# Patient Record
Sex: Male | Born: 1945 | Race: White | Hispanic: No | Marital: Married | Smoking: Former smoker
Health system: Southern US, Community
[De-identification: ages and names within clinical notes are randomized; demographics above are authoritative.]

## PROBLEM LIST (undated history)

## (undated) DIAGNOSIS — K219 Gastro-esophageal reflux disease without esophagitis: Secondary | ICD-10-CM

## (undated) DIAGNOSIS — I2510 Atherosclerotic heart disease of native coronary artery without angina pectoris: Secondary | ICD-10-CM

## (undated) DIAGNOSIS — G8929 Other chronic pain: Secondary | ICD-10-CM

## (undated) DIAGNOSIS — I214 Non-ST elevation (NSTEMI) myocardial infarction: Secondary | ICD-10-CM

## (undated) DIAGNOSIS — I8390 Asymptomatic varicose veins of unspecified lower extremity: Secondary | ICD-10-CM

## (undated) DIAGNOSIS — N400 Enlarged prostate without lower urinary tract symptoms: Secondary | ICD-10-CM

## (undated) DIAGNOSIS — M549 Dorsalgia, unspecified: Secondary | ICD-10-CM

## (undated) DIAGNOSIS — I10 Essential (primary) hypertension: Secondary | ICD-10-CM

## (undated) HISTORY — DX: Asymptomatic varicose veins of unspecified lower extremity: I83.90

## (undated) HISTORY — DX: Dorsalgia, unspecified: M54.9

## (undated) HISTORY — DX: Non-ST elevation (NSTEMI) myocardial infarction: I21.4

## (undated) HISTORY — DX: Essential (primary) hypertension: I10

## (undated) HISTORY — PX: CHOLECYSTECTOMY: SHX55

## (undated) HISTORY — DX: Atherosclerotic heart disease of native coronary artery without angina pectoris: I25.10

## (undated) HISTORY — PX: SPINAL FUSION: SHX223

## (undated) HISTORY — DX: Gastro-esophageal reflux disease without esophagitis: K21.9

## (undated) HISTORY — DX: Benign prostatic hyperplasia without lower urinary tract symptoms: N40.0

## (undated) HISTORY — DX: Other chronic pain: G89.29

---

## 2015-10-24 DIAGNOSIS — N390 Urinary tract infection, site not specified: Secondary | ICD-10-CM | POA: Diagnosis not present

## 2015-10-24 DIAGNOSIS — E669 Obesity, unspecified: Secondary | ICD-10-CM | POA: Diagnosis not present

## 2015-10-24 DIAGNOSIS — Z6841 Body Mass Index (BMI) 40.0 and over, adult: Secondary | ICD-10-CM | POA: Diagnosis not present

## 2015-10-24 DIAGNOSIS — N400 Enlarged prostate without lower urinary tract symptoms: Secondary | ICD-10-CM | POA: Diagnosis not present

## 2015-10-24 DIAGNOSIS — M545 Low back pain: Secondary | ICD-10-CM | POA: Diagnosis not present

## 2015-10-24 DIAGNOSIS — Z87891 Personal history of nicotine dependence: Secondary | ICD-10-CM | POA: Diagnosis not present

## 2015-11-16 DIAGNOSIS — N419 Inflammatory disease of prostate, unspecified: Secondary | ICD-10-CM | POA: Diagnosis not present

## 2015-11-16 DIAGNOSIS — R3915 Urgency of urination: Secondary | ICD-10-CM | POA: Diagnosis not present

## 2015-11-16 DIAGNOSIS — Z87891 Personal history of nicotine dependence: Secondary | ICD-10-CM | POA: Diagnosis not present

## 2015-11-23 DIAGNOSIS — M1288 Other specific arthropathies, not elsewhere classified, other specified site: Secondary | ICD-10-CM | POA: Diagnosis not present

## 2015-11-23 DIAGNOSIS — M47816 Spondylosis without myelopathy or radiculopathy, lumbar region: Secondary | ICD-10-CM | POA: Diagnosis not present

## 2015-11-23 DIAGNOSIS — M4316 Spondylolisthesis, lumbar region: Secondary | ICD-10-CM | POA: Diagnosis not present

## 2015-11-30 DIAGNOSIS — Z6841 Body Mass Index (BMI) 40.0 and over, adult: Secondary | ICD-10-CM | POA: Diagnosis not present

## 2015-11-30 DIAGNOSIS — Z1389 Encounter for screening for other disorder: Secondary | ICD-10-CM | POA: Diagnosis not present

## 2015-11-30 DIAGNOSIS — E7800 Pure hypercholesterolemia, unspecified: Secondary | ICD-10-CM | POA: Diagnosis not present

## 2015-11-30 DIAGNOSIS — Z125 Encounter for screening for malignant neoplasm of prostate: Secondary | ICD-10-CM | POA: Diagnosis not present

## 2015-11-30 DIAGNOSIS — Z0000 Encounter for general adult medical examination without abnormal findings: Secondary | ICD-10-CM | POA: Diagnosis not present

## 2015-11-30 DIAGNOSIS — Z7189 Other specified counseling: Secondary | ICD-10-CM | POA: Diagnosis not present

## 2015-11-30 DIAGNOSIS — Z299 Encounter for prophylactic measures, unspecified: Secondary | ICD-10-CM | POA: Diagnosis not present

## 2015-11-30 DIAGNOSIS — Z1211 Encounter for screening for malignant neoplasm of colon: Secondary | ICD-10-CM | POA: Diagnosis not present

## 2015-11-30 DIAGNOSIS — R5383 Other fatigue: Secondary | ICD-10-CM | POA: Diagnosis not present

## 2016-01-18 DIAGNOSIS — M5386 Other specified dorsopathies, lumbar region: Secondary | ICD-10-CM | POA: Diagnosis not present

## 2016-01-18 DIAGNOSIS — J9811 Atelectasis: Secondary | ICD-10-CM | POA: Diagnosis not present

## 2016-01-18 DIAGNOSIS — E669 Obesity, unspecified: Secondary | ICD-10-CM | POA: Diagnosis not present

## 2016-01-18 DIAGNOSIS — Z981 Arthrodesis status: Secondary | ICD-10-CM | POA: Diagnosis not present

## 2016-01-18 DIAGNOSIS — Z6841 Body Mass Index (BMI) 40.0 and over, adult: Secondary | ICD-10-CM | POA: Diagnosis not present

## 2016-01-18 DIAGNOSIS — I10 Essential (primary) hypertension: Secondary | ICD-10-CM | POA: Diagnosis not present

## 2016-01-18 DIAGNOSIS — Z79899 Other long term (current) drug therapy: Secondary | ICD-10-CM | POA: Diagnosis not present

## 2016-01-18 DIAGNOSIS — M47896 Other spondylosis, lumbar region: Secondary | ICD-10-CM | POA: Diagnosis not present

## 2016-01-18 DIAGNOSIS — E871 Hypo-osmolality and hyponatremia: Secondary | ICD-10-CM | POA: Diagnosis not present

## 2016-01-18 DIAGNOSIS — M4316 Spondylolisthesis, lumbar region: Secondary | ICD-10-CM | POA: Diagnosis not present

## 2016-01-18 DIAGNOSIS — K567 Ileus, unspecified: Secondary | ICD-10-CM | POA: Diagnosis not present

## 2016-01-18 DIAGNOSIS — N400 Enlarged prostate without lower urinary tract symptoms: Secondary | ICD-10-CM | POA: Diagnosis not present

## 2016-01-18 DIAGNOSIS — K219 Gastro-esophageal reflux disease without esophagitis: Secondary | ICD-10-CM | POA: Diagnosis not present

## 2016-01-18 DIAGNOSIS — K5660 Unspecified intestinal obstruction: Secondary | ICD-10-CM | POA: Diagnosis not present

## 2016-01-18 DIAGNOSIS — Z888 Allergy status to other drugs, medicaments and biological substances status: Secondary | ICD-10-CM | POA: Diagnosis not present

## 2016-01-18 DIAGNOSIS — Z4682 Encounter for fitting and adjustment of non-vascular catheter: Secondary | ICD-10-CM | POA: Diagnosis not present

## 2016-01-18 DIAGNOSIS — M1288 Other specific arthropathies, not elsewhere classified, other specified site: Secondary | ICD-10-CM | POA: Diagnosis not present

## 2016-01-18 DIAGNOSIS — M069 Rheumatoid arthritis, unspecified: Secondary | ICD-10-CM | POA: Diagnosis not present

## 2016-01-18 DIAGNOSIS — M47816 Spondylosis without myelopathy or radiculopathy, lumbar region: Secondary | ICD-10-CM | POA: Diagnosis not present

## 2016-01-25 DIAGNOSIS — M1288 Other specific arthropathies, not elsewhere classified, other specified site: Secondary | ICD-10-CM | POA: Diagnosis not present

## 2016-01-25 DIAGNOSIS — Z9889 Other specified postprocedural states: Secondary | ICD-10-CM | POA: Diagnosis not present

## 2016-01-25 DIAGNOSIS — M4186 Other forms of scoliosis, lumbar region: Secondary | ICD-10-CM | POA: Diagnosis not present

## 2016-01-30 DIAGNOSIS — M549 Dorsalgia, unspecified: Secondary | ICD-10-CM | POA: Diagnosis not present

## 2016-01-30 DIAGNOSIS — Z299 Encounter for prophylactic measures, unspecified: Secondary | ICD-10-CM | POA: Diagnosis not present

## 2016-01-30 DIAGNOSIS — E7800 Pure hypercholesterolemia, unspecified: Secondary | ICD-10-CM | POA: Diagnosis not present

## 2016-01-30 DIAGNOSIS — I10 Essential (primary) hypertension: Secondary | ICD-10-CM | POA: Diagnosis not present

## 2016-02-15 DIAGNOSIS — R079 Chest pain, unspecified: Secondary | ICD-10-CM | POA: Diagnosis not present

## 2016-02-15 DIAGNOSIS — R0989 Other specified symptoms and signs involving the circulatory and respiratory systems: Secondary | ICD-10-CM | POA: Diagnosis not present

## 2016-02-15 DIAGNOSIS — J189 Pneumonia, unspecified organism: Secondary | ICD-10-CM | POA: Diagnosis not present

## 2016-02-15 DIAGNOSIS — R0602 Shortness of breath: Secondary | ICD-10-CM | POA: Diagnosis not present

## 2016-02-15 DIAGNOSIS — Z87891 Personal history of nicotine dependence: Secondary | ICD-10-CM | POA: Diagnosis not present

## 2016-02-15 DIAGNOSIS — R05 Cough: Secondary | ICD-10-CM | POA: Diagnosis not present

## 2016-02-15 DIAGNOSIS — R918 Other nonspecific abnormal finding of lung field: Secondary | ICD-10-CM | POA: Diagnosis not present

## 2016-02-21 DIAGNOSIS — M170 Bilateral primary osteoarthritis of knee: Secondary | ICD-10-CM | POA: Diagnosis present

## 2016-02-21 DIAGNOSIS — J189 Pneumonia, unspecified organism: Secondary | ICD-10-CM | POA: Diagnosis not present

## 2016-02-21 DIAGNOSIS — J440 Chronic obstructive pulmonary disease with acute lower respiratory infection: Secondary | ICD-10-CM | POA: Diagnosis not present

## 2016-02-21 DIAGNOSIS — I10 Essential (primary) hypertension: Secondary | ICD-10-CM | POA: Diagnosis present

## 2016-02-21 DIAGNOSIS — Z87891 Personal history of nicotine dependence: Secondary | ICD-10-CM | POA: Diagnosis not present

## 2016-02-21 DIAGNOSIS — Z79899 Other long term (current) drug therapy: Secondary | ICD-10-CM | POA: Diagnosis not present

## 2016-02-21 DIAGNOSIS — E669 Obesity, unspecified: Secondary | ICD-10-CM | POA: Diagnosis present

## 2016-02-21 DIAGNOSIS — Z6841 Body Mass Index (BMI) 40.0 and over, adult: Secondary | ICD-10-CM | POA: Diagnosis not present

## 2016-02-21 DIAGNOSIS — Z981 Arthrodesis status: Secondary | ICD-10-CM | POA: Diagnosis not present

## 2016-02-21 DIAGNOSIS — E119 Type 2 diabetes mellitus without complications: Secondary | ICD-10-CM | POA: Diagnosis not present

## 2016-03-08 DIAGNOSIS — Z299 Encounter for prophylactic measures, unspecified: Secondary | ICD-10-CM | POA: Diagnosis not present

## 2016-03-08 DIAGNOSIS — M549 Dorsalgia, unspecified: Secondary | ICD-10-CM | POA: Diagnosis not present

## 2016-03-08 DIAGNOSIS — I10 Essential (primary) hypertension: Secondary | ICD-10-CM | POA: Diagnosis not present

## 2016-03-08 DIAGNOSIS — J189 Pneumonia, unspecified organism: Secondary | ICD-10-CM | POA: Diagnosis not present

## 2016-04-04 DIAGNOSIS — Z23 Encounter for immunization: Secondary | ICD-10-CM | POA: Diagnosis not present

## 2016-06-07 DIAGNOSIS — E7800 Pure hypercholesterolemia, unspecified: Secondary | ICD-10-CM | POA: Diagnosis not present

## 2016-06-07 DIAGNOSIS — Z713 Dietary counseling and surveillance: Secondary | ICD-10-CM | POA: Diagnosis not present

## 2016-06-07 DIAGNOSIS — I10 Essential (primary) hypertension: Secondary | ICD-10-CM | POA: Diagnosis not present

## 2016-06-07 DIAGNOSIS — Z6839 Body mass index (BMI) 39.0-39.9, adult: Secondary | ICD-10-CM | POA: Diagnosis not present

## 2016-06-07 DIAGNOSIS — Z299 Encounter for prophylactic measures, unspecified: Secondary | ICD-10-CM | POA: Diagnosis not present

## 2016-06-14 DIAGNOSIS — Z23 Encounter for immunization: Secondary | ICD-10-CM | POA: Diagnosis not present

## 2016-07-24 DIAGNOSIS — M47816 Spondylosis without myelopathy or radiculopathy, lumbar region: Secondary | ICD-10-CM | POA: Diagnosis not present

## 2016-09-23 DIAGNOSIS — M47816 Spondylosis without myelopathy or radiculopathy, lumbar region: Secondary | ICD-10-CM | POA: Diagnosis not present

## 2016-10-03 DIAGNOSIS — M47816 Spondylosis without myelopathy or radiculopathy, lumbar region: Secondary | ICD-10-CM | POA: Diagnosis not present

## 2016-10-03 DIAGNOSIS — M9983 Other biomechanical lesions of lumbar region: Secondary | ICD-10-CM | POA: Diagnosis not present

## 2016-10-03 DIAGNOSIS — M79604 Pain in right leg: Secondary | ICD-10-CM | POA: Diagnosis not present

## 2016-10-03 DIAGNOSIS — M48061 Spinal stenosis, lumbar region without neurogenic claudication: Secondary | ICD-10-CM | POA: Diagnosis not present

## 2016-10-03 DIAGNOSIS — Z981 Arthrodesis status: Secondary | ICD-10-CM | POA: Diagnosis not present

## 2016-10-03 DIAGNOSIS — M5126 Other intervertebral disc displacement, lumbar region: Secondary | ICD-10-CM | POA: Diagnosis not present

## 2016-10-09 DIAGNOSIS — M4316 Spondylolisthesis, lumbar region: Secondary | ICD-10-CM | POA: Diagnosis not present

## 2016-10-09 DIAGNOSIS — M1288 Other specific arthropathies, not elsewhere classified, other specified site: Secondary | ICD-10-CM | POA: Diagnosis not present

## 2016-10-09 DIAGNOSIS — M47816 Spondylosis without myelopathy or radiculopathy, lumbar region: Secondary | ICD-10-CM | POA: Diagnosis not present

## 2016-10-23 DIAGNOSIS — M4316 Spondylolisthesis, lumbar region: Secondary | ICD-10-CM | POA: Diagnosis not present

## 2016-10-23 DIAGNOSIS — M47816 Spondylosis without myelopathy or radiculopathy, lumbar region: Secondary | ICD-10-CM | POA: Diagnosis not present

## 2016-12-16 DIAGNOSIS — M47816 Spondylosis without myelopathy or radiculopathy, lumbar region: Secondary | ICD-10-CM | POA: Diagnosis not present

## 2016-12-16 DIAGNOSIS — M4316 Spondylolisthesis, lumbar region: Secondary | ICD-10-CM | POA: Diagnosis not present

## 2017-01-13 DIAGNOSIS — E669 Obesity, unspecified: Secondary | ICD-10-CM | POA: Diagnosis not present

## 2017-01-13 DIAGNOSIS — Z6841 Body Mass Index (BMI) 40.0 and over, adult: Secondary | ICD-10-CM | POA: Diagnosis not present

## 2017-01-13 DIAGNOSIS — K210 Gastro-esophageal reflux disease with esophagitis: Secondary | ICD-10-CM | POA: Diagnosis not present

## 2017-01-13 DIAGNOSIS — K219 Gastro-esophageal reflux disease without esophagitis: Secondary | ICD-10-CM | POA: Diagnosis not present

## 2017-01-13 DIAGNOSIS — I10 Essential (primary) hypertension: Secondary | ICD-10-CM | POA: Diagnosis not present

## 2017-01-13 DIAGNOSIS — Z713 Dietary counseling and surveillance: Secondary | ICD-10-CM | POA: Diagnosis not present

## 2017-01-13 DIAGNOSIS — Z299 Encounter for prophylactic measures, unspecified: Secondary | ICD-10-CM | POA: Diagnosis not present

## 2017-01-13 DIAGNOSIS — E7800 Pure hypercholesterolemia, unspecified: Secondary | ICD-10-CM | POA: Diagnosis not present

## 2017-03-05 DIAGNOSIS — C44319 Basal cell carcinoma of skin of other parts of face: Secondary | ICD-10-CM | POA: Diagnosis not present

## 2017-03-05 DIAGNOSIS — D485 Neoplasm of uncertain behavior of skin: Secondary | ICD-10-CM | POA: Diagnosis not present

## 2017-03-05 DIAGNOSIS — Z85820 Personal history of malignant melanoma of skin: Secondary | ICD-10-CM | POA: Diagnosis not present

## 2017-03-05 DIAGNOSIS — L570 Actinic keratosis: Secondary | ICD-10-CM | POA: Diagnosis not present

## 2017-03-05 DIAGNOSIS — L820 Inflamed seborrheic keratosis: Secondary | ICD-10-CM | POA: Diagnosis not present

## 2017-03-05 DIAGNOSIS — C4441 Basal cell carcinoma of skin of scalp and neck: Secondary | ICD-10-CM | POA: Diagnosis not present

## 2017-03-17 DIAGNOSIS — Z0000 Encounter for general adult medical examination without abnormal findings: Secondary | ICD-10-CM | POA: Diagnosis not present

## 2017-03-17 DIAGNOSIS — R5383 Other fatigue: Secondary | ICD-10-CM | POA: Diagnosis not present

## 2017-03-17 DIAGNOSIS — Z6841 Body Mass Index (BMI) 40.0 and over, adult: Secondary | ICD-10-CM | POA: Diagnosis not present

## 2017-03-17 DIAGNOSIS — Z1389 Encounter for screening for other disorder: Secondary | ICD-10-CM | POA: Diagnosis not present

## 2017-03-17 DIAGNOSIS — N400 Enlarged prostate without lower urinary tract symptoms: Secondary | ICD-10-CM | POA: Diagnosis not present

## 2017-03-17 DIAGNOSIS — I8390 Asymptomatic varicose veins of unspecified lower extremity: Secondary | ICD-10-CM | POA: Diagnosis not present

## 2017-03-17 DIAGNOSIS — Z1211 Encounter for screening for malignant neoplasm of colon: Secondary | ICD-10-CM | POA: Diagnosis not present

## 2017-03-17 DIAGNOSIS — I10 Essential (primary) hypertension: Secondary | ICD-10-CM | POA: Diagnosis not present

## 2017-03-17 DIAGNOSIS — Z299 Encounter for prophylactic measures, unspecified: Secondary | ICD-10-CM | POA: Diagnosis not present

## 2017-03-17 DIAGNOSIS — Z79899 Other long term (current) drug therapy: Secondary | ICD-10-CM | POA: Diagnosis not present

## 2017-03-17 DIAGNOSIS — Z7189 Other specified counseling: Secondary | ICD-10-CM | POA: Diagnosis not present

## 2017-03-17 DIAGNOSIS — E7800 Pure hypercholesterolemia, unspecified: Secondary | ICD-10-CM | POA: Diagnosis not present

## 2017-03-18 DIAGNOSIS — R5383 Other fatigue: Secondary | ICD-10-CM | POA: Diagnosis not present

## 2017-03-18 DIAGNOSIS — E7800 Pure hypercholesterolemia, unspecified: Secondary | ICD-10-CM | POA: Diagnosis not present

## 2017-03-18 DIAGNOSIS — Z125 Encounter for screening for malignant neoplasm of prostate: Secondary | ICD-10-CM | POA: Diagnosis not present

## 2017-03-18 DIAGNOSIS — Z79899 Other long term (current) drug therapy: Secondary | ICD-10-CM | POA: Diagnosis not present

## 2017-03-18 DIAGNOSIS — M47816 Spondylosis without myelopathy or radiculopathy, lumbar region: Secondary | ICD-10-CM | POA: Diagnosis not present

## 2017-03-25 DIAGNOSIS — Z23 Encounter for immunization: Secondary | ICD-10-CM | POA: Diagnosis not present

## 2017-04-10 DIAGNOSIS — C4441 Basal cell carcinoma of skin of scalp and neck: Secondary | ICD-10-CM | POA: Diagnosis not present

## 2017-04-10 DIAGNOSIS — C44319 Basal cell carcinoma of skin of other parts of face: Secondary | ICD-10-CM | POA: Diagnosis not present

## 2017-05-23 DIAGNOSIS — N400 Enlarged prostate without lower urinary tract symptoms: Secondary | ICD-10-CM | POA: Diagnosis present

## 2017-05-23 DIAGNOSIS — I200 Unstable angina: Secondary | ICD-10-CM | POA: Diagnosis not present

## 2017-05-23 DIAGNOSIS — Z888 Allergy status to other drugs, medicaments and biological substances status: Secondary | ICD-10-CM | POA: Diagnosis not present

## 2017-05-23 DIAGNOSIS — R600 Localized edema: Secondary | ICD-10-CM | POA: Diagnosis not present

## 2017-05-23 DIAGNOSIS — Z79899 Other long term (current) drug therapy: Secondary | ICD-10-CM | POA: Diagnosis not present

## 2017-05-23 DIAGNOSIS — M79602 Pain in left arm: Secondary | ICD-10-CM | POA: Diagnosis not present

## 2017-05-23 DIAGNOSIS — I21A1 Myocardial infarction type 2: Secondary | ICD-10-CM | POA: Diagnosis not present

## 2017-05-23 DIAGNOSIS — F419 Anxiety disorder, unspecified: Secondary | ICD-10-CM | POA: Diagnosis not present

## 2017-05-23 DIAGNOSIS — G8929 Other chronic pain: Secondary | ICD-10-CM | POA: Diagnosis not present

## 2017-05-23 DIAGNOSIS — I214 Non-ST elevation (NSTEMI) myocardial infarction: Secondary | ICD-10-CM | POA: Diagnosis not present

## 2017-05-23 DIAGNOSIS — K219 Gastro-esophageal reflux disease without esophagitis: Secondary | ICD-10-CM | POA: Diagnosis present

## 2017-05-23 DIAGNOSIS — I169 Hypertensive crisis, unspecified: Secondary | ICD-10-CM | POA: Diagnosis not present

## 2017-05-23 DIAGNOSIS — I10 Essential (primary) hypertension: Secondary | ICD-10-CM | POA: Diagnosis not present

## 2017-05-23 DIAGNOSIS — R079 Chest pain, unspecified: Secondary | ICD-10-CM | POA: Diagnosis not present

## 2017-05-23 DIAGNOSIS — I161 Hypertensive emergency: Secondary | ICD-10-CM | POA: Diagnosis not present

## 2017-05-23 DIAGNOSIS — M1990 Unspecified osteoarthritis, unspecified site: Secondary | ICD-10-CM | POA: Diagnosis not present

## 2017-05-23 DIAGNOSIS — I2510 Atherosclerotic heart disease of native coronary artery without angina pectoris: Secondary | ICD-10-CM | POA: Diagnosis not present

## 2017-05-23 DIAGNOSIS — I25110 Atherosclerotic heart disease of native coronary artery with unstable angina pectoris: Secondary | ICD-10-CM | POA: Diagnosis present

## 2017-05-24 DIAGNOSIS — I214 Non-ST elevation (NSTEMI) myocardial infarction: Secondary | ICD-10-CM | POA: Insufficient documentation

## 2017-05-30 DIAGNOSIS — E7800 Pure hypercholesterolemia, unspecified: Secondary | ICD-10-CM | POA: Diagnosis not present

## 2017-05-30 DIAGNOSIS — K219 Gastro-esophageal reflux disease without esophagitis: Secondary | ICD-10-CM | POA: Diagnosis not present

## 2017-05-30 DIAGNOSIS — Z6841 Body Mass Index (BMI) 40.0 and over, adult: Secondary | ICD-10-CM | POA: Diagnosis not present

## 2017-05-30 DIAGNOSIS — I2510 Atherosclerotic heart disease of native coronary artery without angina pectoris: Secondary | ICD-10-CM | POA: Diagnosis not present

## 2017-05-30 DIAGNOSIS — Z789 Other specified health status: Secondary | ICD-10-CM | POA: Diagnosis not present

## 2017-05-30 DIAGNOSIS — I10 Essential (primary) hypertension: Secondary | ICD-10-CM | POA: Diagnosis not present

## 2017-06-16 DIAGNOSIS — R0602 Shortness of breath: Secondary | ICD-10-CM | POA: Diagnosis not present

## 2017-06-16 DIAGNOSIS — I2510 Atherosclerotic heart disease of native coronary artery without angina pectoris: Secondary | ICD-10-CM | POA: Diagnosis not present

## 2017-06-16 DIAGNOSIS — E7800 Pure hypercholesterolemia, unspecified: Secondary | ICD-10-CM | POA: Diagnosis not present

## 2017-06-16 DIAGNOSIS — Z6841 Body Mass Index (BMI) 40.0 and over, adult: Secondary | ICD-10-CM | POA: Diagnosis not present

## 2017-06-16 DIAGNOSIS — I8390 Asymptomatic varicose veins of unspecified lower extremity: Secondary | ICD-10-CM | POA: Diagnosis not present

## 2017-06-16 DIAGNOSIS — I10 Essential (primary) hypertension: Secondary | ICD-10-CM | POA: Diagnosis not present

## 2017-06-16 DIAGNOSIS — Z299 Encounter for prophylactic measures, unspecified: Secondary | ICD-10-CM | POA: Diagnosis not present

## 2017-06-16 DIAGNOSIS — I219 Acute myocardial infarction, unspecified: Secondary | ICD-10-CM | POA: Diagnosis not present

## 2017-08-25 DIAGNOSIS — M79672 Pain in left foot: Secondary | ICD-10-CM | POA: Diagnosis not present

## 2017-08-25 DIAGNOSIS — M25579 Pain in unspecified ankle and joints of unspecified foot: Secondary | ICD-10-CM | POA: Diagnosis not present

## 2017-08-25 DIAGNOSIS — M722 Plantar fascial fibromatosis: Secondary | ICD-10-CM | POA: Diagnosis not present

## 2017-09-01 DIAGNOSIS — M549 Dorsalgia, unspecified: Secondary | ICD-10-CM | POA: Diagnosis not present

## 2017-09-01 DIAGNOSIS — I10 Essential (primary) hypertension: Secondary | ICD-10-CM | POA: Diagnosis not present

## 2017-09-01 DIAGNOSIS — E7800 Pure hypercholesterolemia, unspecified: Secondary | ICD-10-CM | POA: Diagnosis not present

## 2017-09-01 DIAGNOSIS — M791 Myalgia, unspecified site: Secondary | ICD-10-CM | POA: Diagnosis not present

## 2017-09-01 DIAGNOSIS — I8390 Asymptomatic varicose veins of unspecified lower extremity: Secondary | ICD-10-CM | POA: Diagnosis not present

## 2017-09-01 DIAGNOSIS — Z6838 Body mass index (BMI) 38.0-38.9, adult: Secondary | ICD-10-CM | POA: Diagnosis not present

## 2017-09-01 DIAGNOSIS — Z299 Encounter for prophylactic measures, unspecified: Secondary | ICD-10-CM | POA: Diagnosis not present

## 2017-09-01 DIAGNOSIS — I2510 Atherosclerotic heart disease of native coronary artery without angina pectoris: Secondary | ICD-10-CM | POA: Diagnosis not present

## 2017-09-01 DIAGNOSIS — I219 Acute myocardial infarction, unspecified: Secondary | ICD-10-CM | POA: Diagnosis not present

## 2017-09-01 DIAGNOSIS — M898X1 Other specified disorders of bone, shoulder: Secondary | ICD-10-CM | POA: Diagnosis not present

## 2017-09-01 DIAGNOSIS — Z789 Other specified health status: Secondary | ICD-10-CM | POA: Diagnosis not present

## 2017-09-09 DIAGNOSIS — M47816 Spondylosis without myelopathy or radiculopathy, lumbar region: Secondary | ICD-10-CM | POA: Diagnosis not present

## 2017-09-15 DIAGNOSIS — M79671 Pain in right foot: Secondary | ICD-10-CM | POA: Diagnosis not present

## 2017-09-15 DIAGNOSIS — M25579 Pain in unspecified ankle and joints of unspecified foot: Secondary | ICD-10-CM | POA: Diagnosis not present

## 2017-09-23 ENCOUNTER — Encounter: Payer: Self-pay | Admitting: Cardiology

## 2017-09-23 NOTE — Progress Notes (Signed)
Cardiology Office Note  Date: 09/24/2017   ID: GERHARD RAPPAPORT, DOB 09-28-1945, MRN [id number]  PCP: Monico Blitz, MD  Consulting Cardiologist: Rozann Lesches, MD   Chief Complaint  Patient presents with  . Coronary Artery Disease    History of Present Illness: Melvin Harris is a 72 y.o. male referred for cardiology consultation by Dr. Manuella Ghazi for follow-up of ischemic heart disease.  I reviewed extensive records and updated his chart.  Patient presented in November 2018 with hypertensive emergency and NSTEMI, was transferred to [facility] and underwent cardiac catheterization which demonstrated overall mild to moderate coronary atherosclerosis with an occluded mid circumflex associated with right to left collaterals, felt to be a chronic finding and not intervened upon.  LVEF was approximately 55%.  Medical therapy was recommended.  Peak troponin I was 0.797.  Records indicate that he was started on Plavix as part of his regimen, ultimately taken off this medication due to development of a rash based on chart review.  He is here with his wife today.  Ports occasional angina symptoms manifested as left arm discomfort at his highest levels of activity, but otherwise has done well.  He does outdoor work and walking on his property for exercise.  He has had no palpitations or syncope.  NYHA class II dyspnea at baseline.  I reviewed his medications which are outlined below.  He is not on a statin at this time.  He tells me that he developed a sore throat on Lipitor.  He is hesitant to try another statin but we did discuss considering a different medication when he gets his lipids checked again by PCP.  He has been watching his diet and is lost he estimates about 20 pounds over the last 6 months.  I personally reviewed his ECG which shows sinus rhythm with leftward axis.  Past Medical History:  Diagnosis Date  . BPH (benign prostatic hyperplasia)   . CAD (coronary artery disease)    Cardiac  catheterization November 2018  - chronically occluded mid circumflex with right-to-left collaterals, otherwise mild to moderate coronary atherosclerosis  . Chronic back pain   . Essential hypertension   . GERD (gastroesophageal reflux disease)   . NSTEMI (non-ST elevated myocardial infarction) [facility])    November 2018 - medically managed  . Varicose vein of leg     Past Surgical History:  Procedure Laterality Date  . CHOLECYSTECTOMY    . SPINAL FUSION      Current Outpatient Medications  Medication Sig Dispense Refill  . aspirin EC 81 MG tablet Take 81 mg by mouth daily.    . finasteride (PROSCAR) 5 MG tablet Take 5 mg by mouth daily.    Marland Kitchen lisinopril (PRINIVIL,ZESTRIL) 20 MG tablet Take 20 mg by mouth daily.    . metoprolol tartrate (LOPRESSOR) 25 MG tablet Take 12.5 mg by mouth 2 (two) times daily.     Marland Kitchen omeprazole (PRILOSEC) 20 MG capsule Take 20 mg by mouth 2 (two) times daily as needed.      No current facility-administered medications for this visit.    Allergies:  Plavix [clopidogrel]   Social History: The patient  reports that he quit smoking about 50 years ago. His smoking use included cigarettes. He has never used smokeless tobacco. He reports that he does not drink alcohol or use drugs.   Family History: The patient's family history includes Hypertension in his mother.   ROS:  Please see the history of present illness. Otherwise,  complete review of systems is positive for none.  All other systems are reviewed and negative.   Physical Exam: VS:  BP 128/78   Pulse 66   Ht 5' (1.524 m)   Wt 226 lb 6.4 oz (102.7 kg)   HC 6" (15.2 cm)   SpO2 97%   BMI 44.22 kg/m , BMI Body mass index is 44.22 kg/m.  Wt Readings from Last 3 Encounters:  09/24/17 226 lb 6.4 oz (102.7 kg)  09/01/17 232 lb (105.2 kg)    General: Patient appears comfortable at rest. HEENT: Conjunctiva and lids normal, oropharynx clear. Neck: Supple, no elevated JVP or carotid bruits, no  thyromegaly. Lungs: Clear to auscultation, nonlabored breathing at rest. Cardiac: Regular rate and rhythm, no S3 or significant systolic murmur, no pericardial rub. Abdomen: Soft, nontender, bowel sounds present. Extremities: No pitting edema, distal pulses 2+. Skin: Warm and dry. Musculoskeletal: No kyphosis. Neuropsychiatric: Alert and oriented x3, affect grossly appropriate.  ECG: No old tracing available for comparison today.  Recent Labwork:  November 2018: Troponin I 0.797, pro-BNP 210, potassium 4.5, BUN 17, creatinine 0.79, AST 43, ALT 67, hemoglobin 14.0, platelets 204, cholesterol 199, triglycerides 132, HDL 46, LDL 127, hemoglobin A1c 5.1, TSH 3.112  Other Studies Reviewed Today:  Cardiac catheterization 05/24/2017 ([facility]): FINDINGS Hemodynamics and Left Heart Catheterization  Aortic pressure: 138/80 mm Hg (mean 104 mm Hg)  Left ventricular filling pressure: elevated (LVEDP = 20 mm Hg). Left Ventriculogram  RAO Left Ventriculogram:Normal  Ejection Fraction (visual estimate): 55%  Mitral Regurgitation: None  Wall motion: Normal Coronary Angiography Dominance: Right Left Main: The left main coronary artery (LMCA) is a large-caliber vessel that originates from the left coronary sinus. It trifurcates into the left anterior descending (LAD), ramus intermedius (RI), and left circumflex (LCx) arteries. There is no angiographic evidence of significant disease in the LMCA. LAD: The LAD is a large-caliber vessel that gives off 2 diagonal (D) branches before it wraps around the apex. D1 is a small-caliber vessel. D2 is a very small-caliber vessel. There is moderate diffuse disease up to 40% in the LAD. Ramus Intermedius: The RI is a large-caliber vessel with moderate diffuse up to 40%. Left Circumflex: The LCx is a large-caliber vessel that gives off 2 obtuse marginal (OM) branches and then continues as a small vessel in the AV groove. OM1 is a very small-caliber vessel. OM2 is a  small-caliber vessel. There is a mid LCx 100% chronic total occlusion. The distal LCx and OM2 fill via right-to-left collaterals. Right Coronary: The right coronary artery (RCA) is a large-caliber vessel originating from the right coronary sinus. It bifurcates distally into the posterior descending artery (PDA) and a posterolateral (PL) branch consistent with a right dominant system. There is mild to moderate diffuse disease up to 30% in the RCA.   Assessment and Plan:  1.  CAD with history of NSTEMI in November 2018 with cardiac catheterization showing occluded mid circumflex associated with right to left collaterals and otherwise mild to moderate nonobstructive disease.  He has been managed medically, did not tolerate Plavix due to rash.  He reports stable angina at highest levels of activity.  Follow-up ECG reviewed today.  2.  Hyperlipidemia, LDL 127 as of November 2018.  He states that he did not tolerate Lipitor.  I talked with him about other statin options.  He did not want to start anything new at this time but will get his lipids checked later this year with Dr. Manuella Ghazi.  3.  Obesity, he is working on weight loss through diet.  4.  Essential hypertension, blood pressure is adequately controlled today.  Current medicines were reviewed with the patient today.   Orders Placed This Encounter  Procedures  . EKG 12-Lead     Disposition: Follow-up in 6 months.  Signed, Satira Sark, MD, [facility] 09/24/2017 1:26 PM    [facility] [place], [place], [place] [address] Phone: [phone]; Fax: [phone]

## 2017-09-24 ENCOUNTER — Encounter: Payer: Self-pay | Admitting: Cardiology

## 2017-09-24 ENCOUNTER — Encounter: Payer: Self-pay | Admitting: *Deleted

## 2017-09-24 ENCOUNTER — Ambulatory Visit (INDEPENDENT_AMBULATORY_CARE_PROVIDER_SITE_OTHER): Payer: Medicare Other | Admitting: Cardiology

## 2017-09-24 VITALS — BP 128/78 | HR 66 | Ht 60.0 in | Wt 226.4 lb

## 2017-09-24 DIAGNOSIS — I10 Essential (primary) hypertension: Secondary | ICD-10-CM

## 2017-09-24 DIAGNOSIS — E782 Mixed hyperlipidemia: Secondary | ICD-10-CM | POA: Diagnosis not present

## 2017-09-24 DIAGNOSIS — I25119 Atherosclerotic heart disease of native coronary artery with unspecified angina pectoris: Secondary | ICD-10-CM

## 2017-09-24 NOTE — Patient Instructions (Signed)

## 2017-10-03 DIAGNOSIS — R350 Frequency of micturition: Secondary | ICD-10-CM | POA: Diagnosis not present

## 2017-10-03 DIAGNOSIS — Z6837 Body mass index (BMI) 37.0-37.9, adult: Secondary | ICD-10-CM | POA: Diagnosis not present

## 2017-10-03 DIAGNOSIS — R6883 Chills (without fever): Secondary | ICD-10-CM | POA: Diagnosis not present

## 2017-10-03 DIAGNOSIS — I10 Essential (primary) hypertension: Secondary | ICD-10-CM | POA: Diagnosis not present

## 2017-10-03 DIAGNOSIS — Z299 Encounter for prophylactic measures, unspecified: Secondary | ICD-10-CM | POA: Diagnosis not present

## 2017-10-15 DIAGNOSIS — Z85820 Personal history of malignant melanoma of skin: Secondary | ICD-10-CM | POA: Diagnosis not present

## 2017-10-15 DIAGNOSIS — L570 Actinic keratosis: Secondary | ICD-10-CM | POA: Diagnosis not present

## 2017-10-15 DIAGNOSIS — D034 Melanoma in situ of scalp and neck: Secondary | ICD-10-CM | POA: Diagnosis not present

## 2017-10-15 DIAGNOSIS — D225 Melanocytic nevi of trunk: Secondary | ICD-10-CM | POA: Diagnosis not present

## 2017-10-15 DIAGNOSIS — C44311 Basal cell carcinoma of skin of nose: Secondary | ICD-10-CM | POA: Diagnosis not present

## 2017-10-15 DIAGNOSIS — L988 Other specified disorders of the skin and subcutaneous tissue: Secondary | ICD-10-CM | POA: Diagnosis not present

## 2017-10-15 DIAGNOSIS — Z85828 Personal history of other malignant neoplasm of skin: Secondary | ICD-10-CM | POA: Diagnosis not present

## 2017-10-15 DIAGNOSIS — D485 Neoplasm of uncertain behavior of skin: Secondary | ICD-10-CM | POA: Diagnosis not present

## 2017-10-27 DIAGNOSIS — M722 Plantar fascial fibromatosis: Secondary | ICD-10-CM | POA: Diagnosis not present

## 2017-10-27 DIAGNOSIS — M79671 Pain in right foot: Secondary | ICD-10-CM | POA: Diagnosis not present

## 2017-10-27 DIAGNOSIS — M25579 Pain in unspecified ankle and joints of unspecified foot: Secondary | ICD-10-CM | POA: Diagnosis not present

## 2017-11-13 DIAGNOSIS — C434 Malignant melanoma of scalp and neck: Secondary | ICD-10-CM | POA: Diagnosis not present

## 2017-11-13 DIAGNOSIS — L988 Other specified disorders of the skin and subcutaneous tissue: Secondary | ICD-10-CM | POA: Diagnosis not present

## 2017-11-13 DIAGNOSIS — D485 Neoplasm of uncertain behavior of skin: Secondary | ICD-10-CM | POA: Diagnosis not present

## 2017-11-27 DIAGNOSIS — C44311 Basal cell carcinoma of skin of nose: Secondary | ICD-10-CM | POA: Diagnosis not present

## 2017-12-16 DIAGNOSIS — M47816 Spondylosis without myelopathy or radiculopathy, lumbar region: Secondary | ICD-10-CM | POA: Insufficient documentation

## 2018-03-19 DIAGNOSIS — Z23 Encounter for immunization: Secondary | ICD-10-CM | POA: Diagnosis not present

## 2018-03-19 DIAGNOSIS — M47816 Spondylosis without myelopathy or radiculopathy, lumbar region: Secondary | ICD-10-CM | POA: Diagnosis not present

## 2018-03-24 DIAGNOSIS — Z6839 Body mass index (BMI) 39.0-39.9, adult: Secondary | ICD-10-CM | POA: Diagnosis not present

## 2018-03-24 DIAGNOSIS — Z299 Encounter for prophylactic measures, unspecified: Secondary | ICD-10-CM | POA: Diagnosis not present

## 2018-03-24 DIAGNOSIS — Z1339 Encounter for screening examination for other mental health and behavioral disorders: Secondary | ICD-10-CM | POA: Diagnosis not present

## 2018-03-24 DIAGNOSIS — Z1331 Encounter for screening for depression: Secondary | ICD-10-CM | POA: Diagnosis not present

## 2018-03-24 DIAGNOSIS — I10 Essential (primary) hypertension: Secondary | ICD-10-CM | POA: Diagnosis not present

## 2018-03-24 DIAGNOSIS — Z125 Encounter for screening for malignant neoplasm of prostate: Secondary | ICD-10-CM | POA: Diagnosis not present

## 2018-03-24 DIAGNOSIS — R5383 Other fatigue: Secondary | ICD-10-CM | POA: Diagnosis not present

## 2018-03-24 DIAGNOSIS — Z1211 Encounter for screening for malignant neoplasm of colon: Secondary | ICD-10-CM | POA: Diagnosis not present

## 2018-03-24 DIAGNOSIS — I8390 Asymptomatic varicose veins of unspecified lower extremity: Secondary | ICD-10-CM | POA: Diagnosis not present

## 2018-03-24 DIAGNOSIS — Z7189 Other specified counseling: Secondary | ICD-10-CM | POA: Diagnosis not present

## 2018-03-24 DIAGNOSIS — Z79899 Other long term (current) drug therapy: Secondary | ICD-10-CM | POA: Diagnosis not present

## 2018-03-24 DIAGNOSIS — E7800 Pure hypercholesterolemia, unspecified: Secondary | ICD-10-CM | POA: Diagnosis not present

## 2018-03-24 DIAGNOSIS — Z0000 Encounter for general adult medical examination without abnormal findings: Secondary | ICD-10-CM | POA: Diagnosis not present

## 2018-03-25 NOTE — Progress Notes (Signed)
Cardiology Office Note  Date: 03/26/2018   ID: Melvin Harris, DOB May 29, 1946, MRN [id number]  PCP: Monico Blitz, MD  Primary Cardiologist: Rozann Lesches, MD   Chief Complaint  Patient presents with  . Coronary Artery Disease    History of Present Illness: Melvin Harris is a 72 y.o. male seen in consultation back in March to establish follow-up of CAD.  He is here today with his wife.  He reports no new angina symptoms or increasing nitroglycerin use.  Remains functional with ADLs, enjoys fishing.  Also doing yard work.  He reports having lab work with Dr. Manuella Ghazi recently, we are requesting the results.  As noted previously he has an intolerance to Lipitor, his last LDL was 127 in November 2018.  We did talk about other options today.  Current cardiac regimen includes aspirin, Lopressor, and lisinopril.  Blood pressure is adequately controlled today.  Past Medical History:  Diagnosis Date  . BPH (benign prostatic hyperplasia)   . CAD (coronary artery disease)    Cardiac catheterization November 2018  - chronically occluded mid circumflex with right-to-left collaterals, otherwise mild to moderate coronary atherosclerosis  . Chronic back pain   . Essential hypertension   . GERD (gastroesophageal reflux disease)   . NSTEMI (non-ST elevated myocardial infarction) [facility])    November 2018 - medically managed  . Varicose vein of leg     Past Surgical History:  Procedure Laterality Date  . CHOLECYSTECTOMY    . SPINAL FUSION      Current Outpatient Medications  Medication Sig Dispense Refill  . aspirin EC 81 MG tablet Take 81 mg by mouth daily.    . finasteride (PROSCAR) 5 MG tablet Take 5 mg by mouth daily.    Marland Kitchen lisinopril (PRINIVIL,ZESTRIL) 20 MG tablet Take 20 mg by mouth daily.    . metoprolol tartrate (LOPRESSOR) 25 MG tablet Take 12.5 mg by mouth 2 (two) times daily.     Marland Kitchen omeprazole (PRILOSEC) 20 MG capsule Take 20 mg by mouth 2 (two) times daily as needed.      No  current facility-administered medications for this visit.    Allergies:  Plavix [clopidogrel]   Social History: The patient  reports that he quit smoking about 50 years ago. His smoking use included cigarettes. He has never used smokeless tobacco. He reports that he does not drink alcohol or use drugs.   ROS:  Please see the history of present illness. Otherwise, complete review of systems is positive for none.  All other systems are reviewed and negative.   Physical Exam: VS:  BP 130/78   Pulse 64   Ht 5' (1.524 m)   Wt 224 lb (101.6 kg)   SpO2 98%   BMI 43.75 kg/m , BMI Body mass index is 43.75 kg/m.  Wt Readings from Last 3 Encounters:  03/26/18 224 lb (101.6 kg)  09/24/17 226 lb 6.4 oz (102.7 kg)  09/01/17 232 lb (105.2 kg)    General: Patient appears comfortable at rest. HEENT: Conjunctiva and lids normal, oropharynx clear. Neck: Supple, no elevated JVP or carotid bruits, no thyromegaly. Lungs: Clear to auscultation, nonlabored breathing at rest. Cardiac: Regular rate and rhythm, no S3 or significant systolic murmur. Abdomen: Soft, nontender, bowel sounds present. Extremities: No pitting edema, distal pulses 2+. Skin: Warm and dry. Musculoskeletal: No kyphosis. Neuropsychiatric: Alert and oriented x3, affect grossly appropriate.  ECG: I personally reviewed the tracing from 09/24/2017 which showed sinus rhythm with leftward axis.  Recent  Labwork:  November 2018: Troponin I 0.797, pro-BNP 210, potassium 4.5, BUN 17, creatinine 0.79, AST 43, ALT 67, hemoglobin 14.0, platelets 204, cholesterol 199, triglycerides 132, HDL 46, LDL 127, hemoglobin A1c 5.1, TSH 3.112  Other Studies Reviewed Today:  Cardiac catheterization 05/24/2017 ([facility]): FINDINGS Hemodynamics and Left Heart Catheterization  Aortic pressure: 138/80 mm Hg (mean 104 mm Hg)  Left ventricular filling pressure: elevated (LVEDP = 20 mm Hg). Left Ventriculogram  RAO Left Ventriculogram:Normal   Ejection Fraction (visual estimate): 55%  Mitral Regurgitation: None  Wall motion: Normal Coronary Angiography Dominance: Right Left Main: The left main coronary artery (LMCA) is a large-caliber vessel that originates from the left coronary sinus. It trifurcates into the left anterior descending (LAD), ramus intermedius (RI), and left circumflex (LCx) arteries. There is no angiographic evidence of significant disease in the LMCA. LAD: The LAD is a large-caliber vessel that gives off 2 diagonal (D) branches before it wraps around the apex. D1 is a small-caliber vessel. D2 is a very small-caliber vessel. There is moderate diffuse disease up to 40% in the LAD. Ramus Intermedius: The RI is a large-caliber vessel with moderate diffuse up to 40%. Left Circumflex: The LCx is a large-caliber vessel that gives off 2 obtuse marginal (OM) branches and then continues as a small vessel in the AV groove. OM1 is a very small-caliber vessel. OM2 is a small-caliber vessel. There is a mid LCx 100% chronic total occlusion. The distal LCx and OM2 fill via right-to-left collaterals. Right Coronary: The right coronary artery (RCA) is a large-caliber vessel originating from the right coronary sinus. It bifurcates distally into the posterior descending artery (PDA) and a posterolateral (PL) branch consistent with a right dominant system. There is mild to moderate diffuse disease up to 30% in the RCA.   Assessment and Plan:  1.  CAD with known occlusion of the mid circumflex associated with right to left collaterals and otherwise mild to moderate nonobstructive disease based on cardiac catheterization at [facility] in November 2018.  He is symptomatically stable without progressive angina and plan is to continue medical therapy and observation.  2.  Mixed hyperlipidemia, requesting follow-up lipid panel from Dr. Manuella Ghazi.  He has a history of Lipitor intolerance.  3.  Morbid obesity.  Weight loss and diet have been discussed.  4.   Essential hypertension, blood pressure control is adequate today.  No changes made to current regimen including lisinopril and Lopressor.  Current medicines were reviewed with the patient today.  Disposition: Follow-up in 6 months.  Signed, Satira Sark, MD, [facility] 03/26/2018 12:12 PM    [facility] at [place] [place], [place] [address] Phone: [phone]; Fax: [phone]

## 2018-03-26 ENCOUNTER — Encounter: Payer: Self-pay | Admitting: Cardiology

## 2018-03-26 ENCOUNTER — Encounter: Payer: Self-pay | Admitting: *Deleted

## 2018-03-26 ENCOUNTER — Ambulatory Visit (INDEPENDENT_AMBULATORY_CARE_PROVIDER_SITE_OTHER): Payer: Medicare Other | Admitting: Cardiology

## 2018-03-26 VITALS — BP 130/78 | HR 64 | Ht 60.0 in | Wt 224.0 lb

## 2018-03-26 DIAGNOSIS — I10 Essential (primary) hypertension: Secondary | ICD-10-CM | POA: Diagnosis not present

## 2018-03-26 DIAGNOSIS — E782 Mixed hyperlipidemia: Secondary | ICD-10-CM

## 2018-03-26 DIAGNOSIS — I25119 Atherosclerotic heart disease of native coronary artery with unspecified angina pectoris: Secondary | ICD-10-CM

## 2018-03-26 NOTE — Patient Instructions (Signed)

## 2018-04-20 DIAGNOSIS — Z299 Encounter for prophylactic measures, unspecified: Secondary | ICD-10-CM | POA: Diagnosis not present

## 2018-04-20 DIAGNOSIS — Z713 Dietary counseling and surveillance: Secondary | ICD-10-CM | POA: Diagnosis not present

## 2018-04-20 DIAGNOSIS — M791 Myalgia, unspecified site: Secondary | ICD-10-CM | POA: Diagnosis not present

## 2018-04-20 DIAGNOSIS — I10 Essential (primary) hypertension: Secondary | ICD-10-CM | POA: Diagnosis not present

## 2018-04-20 DIAGNOSIS — Z6839 Body mass index (BMI) 39.0-39.9, adult: Secondary | ICD-10-CM | POA: Diagnosis not present

## 2018-04-21 DIAGNOSIS — Z85820 Personal history of malignant melanoma of skin: Secondary | ICD-10-CM | POA: Diagnosis not present

## 2018-04-21 DIAGNOSIS — L570 Actinic keratosis: Secondary | ICD-10-CM | POA: Diagnosis not present

## 2018-04-21 DIAGNOSIS — Z85828 Personal history of other malignant neoplasm of skin: Secondary | ICD-10-CM | POA: Diagnosis not present

## 2018-04-24 DIAGNOSIS — M47816 Spondylosis without myelopathy or radiculopathy, lumbar region: Secondary | ICD-10-CM | POA: Diagnosis not present

## 2018-04-29 DIAGNOSIS — M8958 Osteolysis, other site: Secondary | ICD-10-CM | POA: Diagnosis not present

## 2018-04-29 DIAGNOSIS — Z981 Arthrodesis status: Secondary | ICD-10-CM | POA: Diagnosis not present

## 2018-04-29 DIAGNOSIS — M48061 Spinal stenosis, lumbar region without neurogenic claudication: Secondary | ICD-10-CM | POA: Diagnosis not present

## 2018-04-29 DIAGNOSIS — M419 Scoliosis, unspecified: Secondary | ICD-10-CM | POA: Diagnosis not present

## 2018-04-29 DIAGNOSIS — M47816 Spondylosis without myelopathy or radiculopathy, lumbar region: Secondary | ICD-10-CM | POA: Diagnosis not present

## 2018-04-29 DIAGNOSIS — M5127 Other intervertebral disc displacement, lumbosacral region: Secondary | ICD-10-CM | POA: Diagnosis not present

## 2018-04-29 DIAGNOSIS — M4807 Spinal stenosis, lumbosacral region: Secondary | ICD-10-CM | POA: Diagnosis not present

## 2018-04-29 DIAGNOSIS — I700 Atherosclerosis of aorta: Secondary | ICD-10-CM | POA: Diagnosis not present

## 2018-05-01 ENCOUNTER — Other Ambulatory Visit: Payer: Self-pay | Admitting: Neurosurgery

## 2018-05-01 DIAGNOSIS — M47816 Spondylosis without myelopathy or radiculopathy, lumbar region: Secondary | ICD-10-CM

## 2018-05-08 ENCOUNTER — Ambulatory Visit
Admission: RE | Admit: 2018-05-08 | Discharge: 2018-05-08 | Disposition: A | Discharge status: Home / Self Care | Payer: Medicare Other | Source: Ambulatory Visit | Attending: Neurosurgery | Admitting: Neurosurgery

## 2018-05-08 DIAGNOSIS — M47816 Spondylosis without myelopathy or radiculopathy, lumbar region: Secondary | ICD-10-CM

## 2018-05-08 DIAGNOSIS — M545 Low back pain: Secondary | ICD-10-CM | POA: Diagnosis not present

## 2018-05-08 MED ORDER — IOPAMIDOL (ISOVUE-M 300) INJECTION 61%
1.0000 mL | Freq: Once | INTRAMUSCULAR | Status: AC
  Administered 2018-05-08: 1 mL via EPIDURAL

## 2018-05-08 MED ORDER — TRIAMCINOLONE ACETONIDE 40 MG/ML IJ SUSP (RADIOLOGY)
60.0000 mg | Freq: Once | INTRAMUSCULAR | Status: AC
  Administered 2018-05-08: 60 mg via EPIDURAL

## 2018-05-08 NOTE — Discharge Instructions (Signed)

## 2018-05-13 ENCOUNTER — Other Ambulatory Visit: Payer: Medicare Other

## 2018-06-09 DIAGNOSIS — M47816 Spondylosis without myelopathy or radiculopathy, lumbar region: Secondary | ICD-10-CM | POA: Diagnosis not present

## 2018-07-20 DIAGNOSIS — Z789 Other specified health status: Secondary | ICD-10-CM | POA: Diagnosis not present

## 2018-07-20 DIAGNOSIS — I219 Acute myocardial infarction, unspecified: Secondary | ICD-10-CM | POA: Diagnosis not present

## 2018-07-20 DIAGNOSIS — Z6841 Body Mass Index (BMI) 40.0 and over, adult: Secondary | ICD-10-CM | POA: Diagnosis not present

## 2018-07-20 DIAGNOSIS — Z299 Encounter for prophylactic measures, unspecified: Secondary | ICD-10-CM | POA: Diagnosis not present

## 2018-07-20 DIAGNOSIS — I10 Essential (primary) hypertension: Secondary | ICD-10-CM | POA: Diagnosis not present

## 2018-07-20 DIAGNOSIS — J069 Acute upper respiratory infection, unspecified: Secondary | ICD-10-CM | POA: Diagnosis not present

## 2018-08-18 DIAGNOSIS — M5415 Radiculopathy, thoracolumbar region: Secondary | ICD-10-CM | POA: Insufficient documentation

## 2018-08-19 ENCOUNTER — Other Ambulatory Visit: Payer: Self-pay | Admitting: Nurse Practitioner

## 2018-08-19 DIAGNOSIS — M5415 Radiculopathy, thoracolumbar region: Secondary | ICD-10-CM

## 2018-08-31 NOTE — Progress Notes (Signed)
Cardiology Office Note  Date: 09/01/2018   ID: Wolfe, [place] [birth year], MRN [id number]  PCP: Monico Blitz, MD  Primary Cardiologist: Rozann Lesches, MD   Chief Complaint  Patient presents with  . Coronary Artery Disease    History of Present Illness: Melvin Harris is a 73 y.o. male last seen in September 2019.  He is here with his wife for a follow-up visit.  He does not report any angina symptoms or nitroglycerin use since last encounter.  He has had some trouble with chronic back pain and is undergoing injections for pain control.  Lab work from September 2019 is outlined below.  I talked with him about his cholesterol control, LDL was 138.  He has prior intolerance to Lipitor.  I did discuss other options with him and he declines any new medications for control of his lipids.  I personally reviewed his ECG today which shows sinus rhythm with low voltage in the precordial leads, poor R wave progression, nonspecific T wave changes.  Past Medical History:  Diagnosis Date  . BPH (benign prostatic hyperplasia)   . CAD (coronary artery disease)    Cardiac catheterization November 2018  - chronically occluded mid circumflex with right-to-left collaterals, otherwise mild to moderate coronary atherosclerosis  . Chronic back pain   . Essential hypertension   . GERD (gastroesophageal reflux disease)   . NSTEMI (non-ST elevated myocardial infarction) [facility])    November 2018 - medically managed  . Varicose vein of leg     Past Surgical History:  Procedure Laterality Date  . CHOLECYSTECTOMY    . SPINAL FUSION      Current Outpatient Medications  Medication Sig Dispense Refill  . aspirin EC 81 MG tablet Take 81 mg by mouth daily.    . finasteride (PROSCAR) 5 MG tablet Take 5 mg by mouth daily.    Marland Kitchen lisinopril (PRINIVIL,ZESTRIL) 20 MG tablet Take 20 mg by mouth daily.    . metoprolol tartrate (LOPRESSOR) 25 MG tablet Take 12.5 mg by mouth 2 (two) times daily.     Marland Kitchen omeprazole  (PRILOSEC) 20 MG capsule Take 20 mg by mouth 2 (two) times daily as needed.      No current facility-administered medications for this visit.    Allergies:  Plavix [clopidogrel]   Social History: The patient  reports that he quit smoking about 51 years ago. His smoking use included cigarettes. He has never used smokeless tobacco. He reports that he does not drink alcohol or use drugs.   ROS:  Please see the history of present illness. Otherwise, complete review of systems is positive for chronic back pain.  All other systems are reviewed and negative.   Physical Exam: VS:  BP (!) 149/88 (BP Location: Left Arm)   Pulse 70   Ht 5\' 6"  (1.676 m)   Wt 256 lb (116.1 kg)   SpO2 96%   BMI 41.32 kg/m , BMI Body mass index is 41.32 kg/m.  Wt Readings from Last 3 Encounters:  09/01/18 256 lb (116.1 kg)  03/26/18 224 lb (101.6 kg)  09/24/17 226 lb 6.4 oz (102.7 kg)    General: Obese male, appears comfortable at rest. HEENT: Conjunctiva and lids normal, oropharynx clear. Neck: Supple, no elevated JVP or carotid bruits, no thyromegaly. Lungs: Clear to auscultation, nonlabored breathing at rest. Cardiac: Regular rate and rhythm, no S3 or significant systolic murmur, no pericardial rub. Abdomen: Soft, nontender, bowel sounds present.. Extremities: No pitting edema, distal pulses 2+.  Skin: Warm and dry. Musculoskeletal: No kyphosis. Neuropsychiatric: Alert and oriented x3, affect grossly appropriate.  ECG: I personally reviewed the tracing from 09/24/2017 which showed sinus rhythm with leftward axis.  Recent Labwork:  September 2019: Hgb 13.9, platelets 204, TSH 2.26, cholesterol 202, TG 102, HDL 44, LDL 138, BUN 15, creatinine 0.9, potassium 5.0, AST 18, ALT 14  Other Studies Reviewed Today:  Cardiac catheterization 05/24/2017([facility]): FINDINGS Hemodynamics and Left Heart Catheterization  Aortic pressure: 138/80 mm Hg (mean 104 mm Hg)  Left ventricular filling pressure:  elevated (LVEDP = 20 mm Hg). Left Ventriculogram  RAO Left Ventriculogram:Normal  Ejection Fraction (visual estimate): 55%  Mitral Regurgitation: None  Wall motion: Normal Coronary Angiography Dominance: Right Left Main: The left main coronary artery (LMCA) is a large-caliber vessel that originates from the left coronary sinus. It trifurcates into the left anterior descending (LAD), ramus intermedius (RI), and left circumflex (LCx) arteries. There is no angiographic evidence of significant disease in the LMCA. LAD: The LAD is a large-caliber vessel that gives off 2 diagonal (D) branches before it wraps around the apex. D1 is a small-caliber vessel. D2 is a very small-caliber vessel. There is moderate diffuse disease up to 40% in the LAD. Ramus Intermedius: The RI is a large-caliber vessel with moderate diffuse up to 40%. Left Circumflex: The LCx is a large-caliber vessel that gives off 2 obtuse marginal (OM) branches and then continues as a small vessel in the AV groove. OM1 is a very small-caliber vessel. OM2 is a small-caliber vessel. There is a mid LCx 100% chronic total occlusion. The distal LCx and OM2 fill via right-to-left collaterals. Right Coronary: The right coronary artery (RCA) is a large-caliber vessel originating from the right coronary sinus. It bifurcates distally into the posterior descending artery (PDA) and a posterolateral (PL) branch consistent with a right dominant system. There is mild to moderate diffuse disease up to 30% in the RCA.  Assessment and Plan:  1.  CAD with occluded mid circumflex associated with right to left collaterals and otherwise mild to moderate nonobstructive disease.  He reports no active angina symptoms and we will plan to continue medical therapy at this point.  He is on aspirin but declines statin therapy.  2.  Mixed hyperlipidemia with most recent LDL 138.  He has prior intolerance to Lipitor.  I did discuss other options with him today and he declines any  other agents.  3.  Essential hypertension, blood pressure is mildly elevated today.  He continues on lisinopril and Lopressor.  Recommended follow-up with Dr. Manuella Ghazi.  Weight loss would also be beneficial.   Current medicines were reviewed with the patient today.   Orders Placed This Encounter  Procedures  . EKG 12-Lead    Disposition: Follow-up in 6 months.  Signed, Satira Sark, MD, [facility] 09/01/2018 [facility] [place], [place], [place] [address] Phone: [phone]; Fax: [phone]

## 2018-09-01 ENCOUNTER — Encounter: Payer: Self-pay | Admitting: Cardiology

## 2018-09-01 ENCOUNTER — Ambulatory Visit (INDEPENDENT_AMBULATORY_CARE_PROVIDER_SITE_OTHER): Payer: Medicare Other | Admitting: Cardiology

## 2018-09-01 VITALS — BP 149/88 | HR 70 | Ht 66.0 in | Wt 256.0 lb

## 2018-09-01 DIAGNOSIS — E782 Mixed hyperlipidemia: Secondary | ICD-10-CM

## 2018-09-01 DIAGNOSIS — I10 Essential (primary) hypertension: Secondary | ICD-10-CM | POA: Diagnosis not present

## 2018-09-01 DIAGNOSIS — I25119 Atherosclerotic heart disease of native coronary artery with unspecified angina pectoris: Secondary | ICD-10-CM | POA: Diagnosis not present

## 2018-09-01 DIAGNOSIS — Z789 Other specified health status: Secondary | ICD-10-CM | POA: Diagnosis not present

## 2018-09-01 NOTE — Patient Instructions (Addendum)

## 2018-09-07 ENCOUNTER — Other Ambulatory Visit: Payer: Self-pay | Admitting: Nurse Practitioner

## 2018-09-07 ENCOUNTER — Ambulatory Visit
Admission: RE | Admit: 2018-09-07 | Discharge: 2018-09-07 | Disposition: A | Discharge status: Home / Self Care | Payer: Medicare Other | Source: Ambulatory Visit | Attending: Nurse Practitioner | Admitting: Nurse Practitioner

## 2018-09-07 DIAGNOSIS — M5415 Radiculopathy, thoracolumbar region: Secondary | ICD-10-CM

## 2018-09-07 DIAGNOSIS — M546 Pain in thoracic spine: Secondary | ICD-10-CM | POA: Diagnosis not present

## 2018-09-07 MED ORDER — IOPAMIDOL (ISOVUE-M 300) INJECTION 61%
1.0000 mL | Freq: Once | INTRAMUSCULAR | Status: AC | PRN
  Administered 2018-09-07: 1 mL via EPIDURAL

## 2018-09-07 MED ORDER — TRIAMCINOLONE ACETONIDE 40 MG/ML IJ SUSP (RADIOLOGY)
60.0000 mg | Freq: Once | INTRAMUSCULAR | Status: AC
  Administered 2018-09-07: 60 mg via EPIDURAL

## 2018-09-07 NOTE — Discharge Instructions (Signed)

## 2018-09-13 ENCOUNTER — Other Ambulatory Visit: Payer: Self-pay | Admitting: Cardiology

## 2018-09-14 NOTE — Telephone Encounter (Signed)
Yes, will send in now. Thanks!

## 2018-09-15 DIAGNOSIS — M5415 Radiculopathy, thoracolumbar region: Secondary | ICD-10-CM | POA: Diagnosis not present

## 2018-09-15 DIAGNOSIS — M47816 Spondylosis without myelopathy or radiculopathy, lumbar region: Secondary | ICD-10-CM | POA: Diagnosis not present

## 2018-12-16 DIAGNOSIS — L570 Actinic keratosis: Secondary | ICD-10-CM | POA: Diagnosis not present

## 2018-12-17 DIAGNOSIS — I10 Essential (primary) hypertension: Secondary | ICD-10-CM | POA: Diagnosis not present

## 2018-12-17 DIAGNOSIS — Z87891 Personal history of nicotine dependence: Secondary | ICD-10-CM | POA: Diagnosis not present

## 2018-12-17 DIAGNOSIS — Z299 Encounter for prophylactic measures, unspecified: Secondary | ICD-10-CM | POA: Diagnosis not present

## 2018-12-17 DIAGNOSIS — M549 Dorsalgia, unspecified: Secondary | ICD-10-CM | POA: Diagnosis not present

## 2018-12-17 DIAGNOSIS — Z6841 Body Mass Index (BMI) 40.0 and over, adult: Secondary | ICD-10-CM | POA: Diagnosis not present

## 2018-12-21 ENCOUNTER — Other Ambulatory Visit: Payer: Self-pay | Admitting: Internal Medicine

## 2018-12-21 DIAGNOSIS — G8929 Other chronic pain: Secondary | ICD-10-CM

## 2018-12-21 DIAGNOSIS — M545 Low back pain, unspecified: Secondary | ICD-10-CM

## 2019-01-04 ENCOUNTER — Other Ambulatory Visit: Payer: Self-pay | Admitting: Internal Medicine

## 2019-01-04 ENCOUNTER — Ambulatory Visit
Admission: RE | Admit: 2019-01-04 | Discharge: 2019-01-04 | Disposition: A | Discharge status: Home / Self Care | Payer: Medicare Other | Source: Ambulatory Visit | Attending: Internal Medicine | Admitting: Internal Medicine

## 2019-01-04 DIAGNOSIS — M546 Pain in thoracic spine: Secondary | ICD-10-CM | POA: Diagnosis not present

## 2019-01-04 DIAGNOSIS — G8929 Other chronic pain: Secondary | ICD-10-CM

## 2019-01-04 DIAGNOSIS — M545 Low back pain, unspecified: Secondary | ICD-10-CM

## 2019-01-04 IMAGING — XA EPIDURAL/NERVE ROOT
2 series · 2 of 2 positions shown · non-contrast
Comparison: none

CLINICAL DATA: Right-sided thoracolumbar pain. Excellent relief
from first right T12 nerve root block and transforaminal injection
on [DATE]. Minimal improvement after more recent repeat
injection on [DATE]. Pain has since recurred.

[Series 1: ortho adipose · 1 of 1 slices shown (1 of 2)]
[im 1/1]
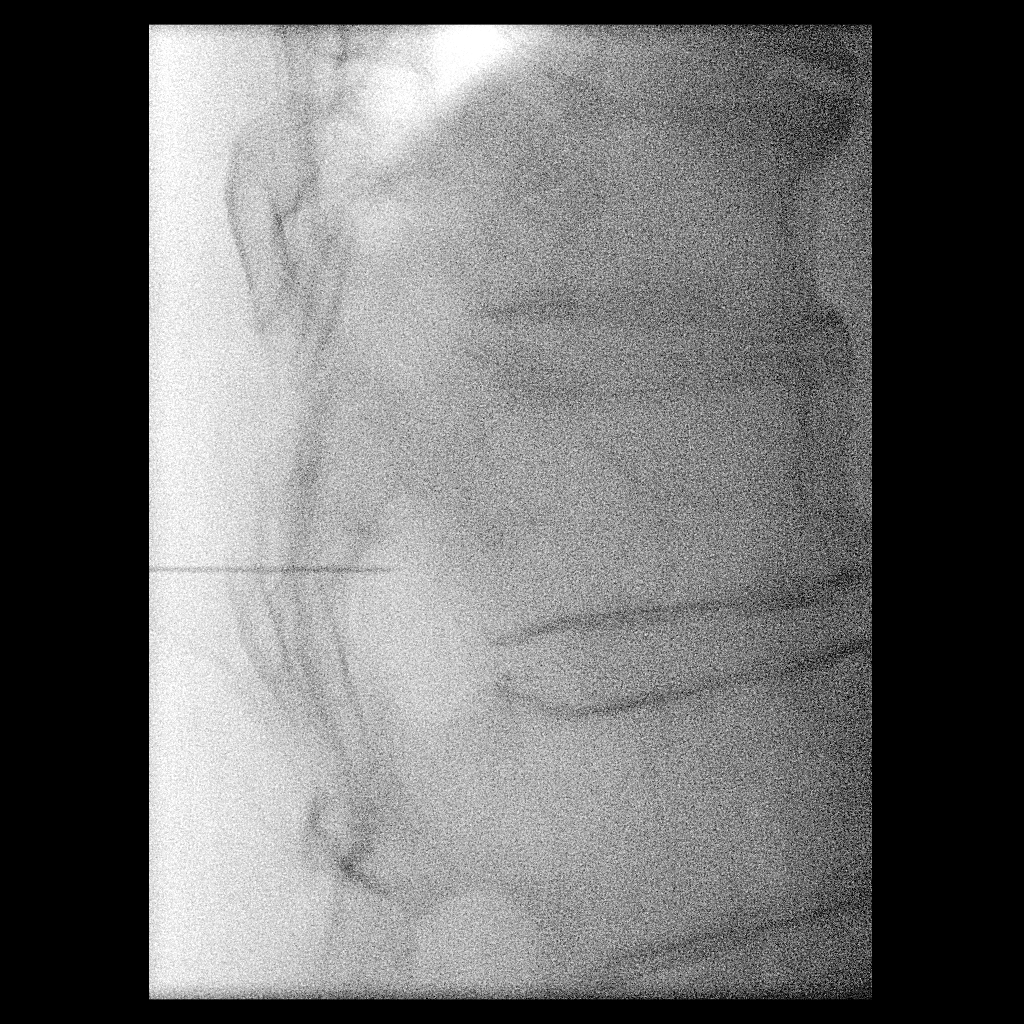

[Series 2: ortho adipose · 1 of 1 slices shown (2 of 2)]
[im 1/1]
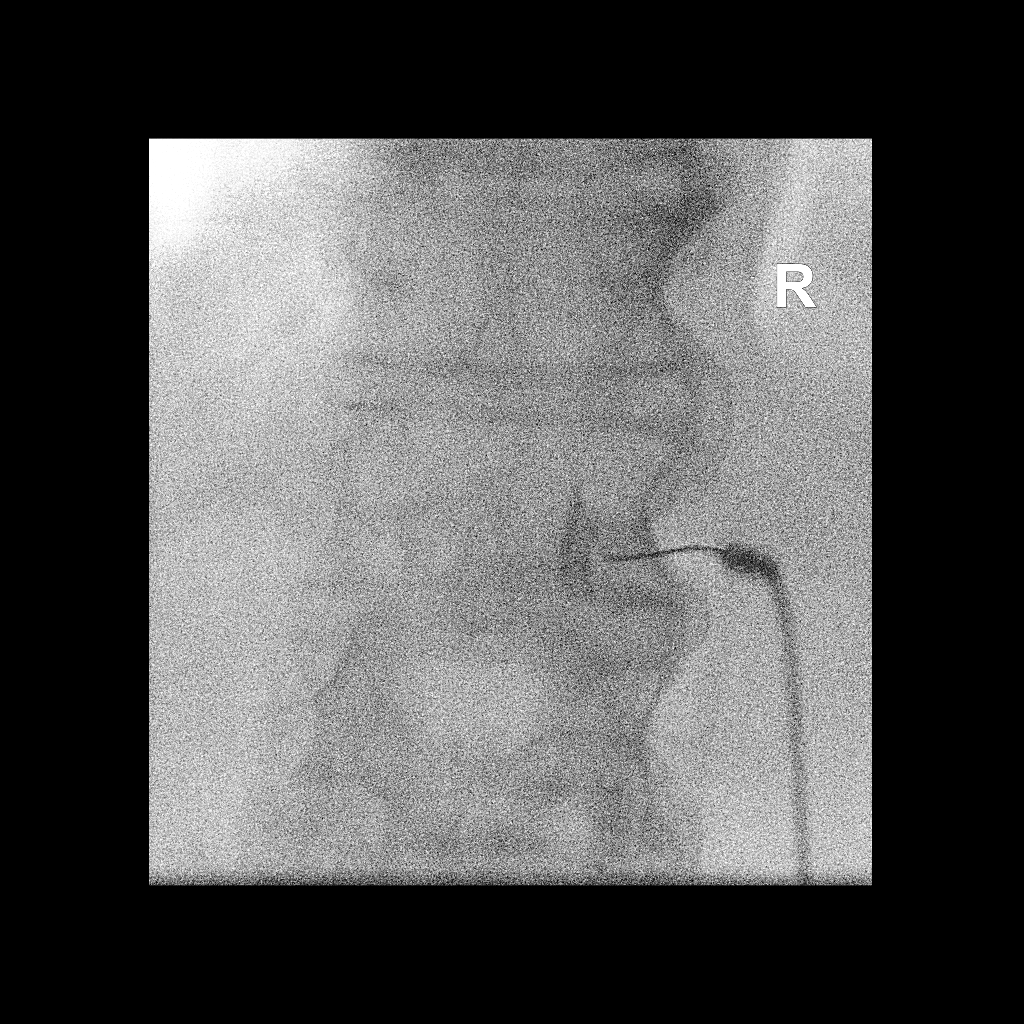

[2 of 2 positions shown; findings below may reference images not displayed]

EXAM:
EPIDURAL/NERVE ROOT

FLUOROSCOPY TIME:  Radiation Exposure Index (as provided by the
fluoroscopic device): 8.3 mGy

Fluoroscopy Time:  12 seconds

Number of Acquired Images:  0

PROCEDURE:
The procedure, risks, benefits, and alternatives were explained to
the patient. Questions regarding the procedure were encouraged and
answered. The patient understands and consents to the procedure.

RIGHT T12 NERVE ROOT BLOCK AND TRANSFORAMINAL EPIDURAL: A posterior
oblique approach was taken to the intervertebral foramen on the
right at T12-L1 using a 3.5 inch 22 gauge spinal needle. Injection
of Isovue M 200 outlined the right T12 nerve root and showed good
epidural spread. No vascular opacification is seen. 60 mg of Kenalog
mixed with 1 mL of 1% lidocaine were instilled. The procedure was
well-tolerated, and the patient was discharged thirty minutes
following the injection in good condition.

COMPLICATIONS:
None immediate.
IMPRESSION: Technically successful injection consisting of a right T12 nerve
root block and transforaminal epidural.

## 2019-01-04 MED ORDER — METHYLPREDNISOLONE ACETATE 40 MG/ML INJ SUSP (RADIOLOG: 120.0000 mg | Freq: Once | INTRAMUSCULAR | Status: DC

## 2019-01-04 MED ORDER — TRIAMCINOLONE ACETONIDE 40 MG/ML IJ SUSP (RADIOLOGY)
60.0000 mg | Freq: Once | INTRAMUSCULAR | Status: AC
  Administered 2019-01-04: 12:00:00 60 mg via EPIDURAL

## 2019-01-04 MED ORDER — IOPAMIDOL (ISOVUE-M 200) INJECTION 41%
1.0000 mL | Freq: Once | INTRAMUSCULAR | Status: AC
  Administered 2019-01-04: 1 mL via EPIDURAL

## 2019-03-05 ENCOUNTER — Ambulatory Visit: Payer: Medicare Other | Admitting: Cardiology

## 2019-04-06 DIAGNOSIS — Z23 Encounter for immunization: Secondary | ICD-10-CM | POA: Diagnosis not present

## 2019-04-26 ENCOUNTER — Other Ambulatory Visit: Payer: Self-pay

## 2019-04-26 ENCOUNTER — Encounter: Payer: Self-pay | Admitting: Cardiology

## 2019-04-26 ENCOUNTER — Ambulatory Visit (INDEPENDENT_AMBULATORY_CARE_PROVIDER_SITE_OTHER): Payer: Medicare Other | Admitting: Cardiology

## 2019-04-26 VITALS — BP 158/80 | HR 84 | Ht 66.0 in | Wt 254.0 lb

## 2019-04-26 DIAGNOSIS — I25119 Atherosclerotic heart disease of native coronary artery with unspecified angina pectoris: Secondary | ICD-10-CM

## 2019-04-26 DIAGNOSIS — Z789 Other specified health status: Secondary | ICD-10-CM

## 2019-04-26 DIAGNOSIS — E782 Mixed hyperlipidemia: Secondary | ICD-10-CM

## 2019-04-26 DIAGNOSIS — I10 Essential (primary) hypertension: Secondary | ICD-10-CM | POA: Diagnosis not present

## 2019-04-26 NOTE — Patient Instructions (Signed)

## 2019-04-26 NOTE — Progress Notes (Signed)
Cardiology Office Note  Date: 04/26/2019   ID: Melvin, Harris 09-Nov-1945, MRN [id number]  PCP:  Monico Blitz, MD  Cardiologist:  Rozann Lesches, MD Electrophysiologist:  None   Chief Complaint  Patient presents with   Cardiac follow-up    History of Present Illness: Melvin Harris is a 73 y.o. male last seen in March.  He presents for a routine visit.  He does not report any active angina at this time, no nitroglycerin use.  He has been very sedentary during the pandemic.  I have talked with him about diet and weight loss.  I talked with him about scheduling follow-up lab work.  He states that he is due to see Dr. Manuella Ghazi next month for a physical with lab work.  I reviewed his medications which are outlined below.  He tells me that he has a blood pressure cuff at home.  I have asked him to make recordings prior to his pending physical in case further blood pressure medication adjustments are needed.  Addition of chlorthalidone could be considered.  He also has statin intolerance, his last LDL was 138.  I would like to review his follow-up lab work.  He did not want to consider PCSK9 inhibitor per prior discussion, but Zetia or cholestyramine would be other options.  Past Medical History:  Diagnosis Date   BPH (benign prostatic hyperplasia)    CAD (coronary artery disease)    Cardiac catheterization November 2018  - chronically occluded mid circumflex with right-to-left collaterals, otherwise mild to moderate coronary atherosclerosis   Chronic back pain    Essential hypertension    GERD (gastroesophageal reflux disease)    NSTEMI (non-ST elevated myocardial infarction) [facility])    November 2018 - medically managed   Varicose vein of leg     Past Surgical History:  Procedure Laterality Date   CHOLECYSTECTOMY     SPINAL FUSION      Current Outpatient Medications  Medication Sig Dispense Refill   aspirin EC 81 MG tablet Take 81 mg by mouth daily.     finasteride  (PROSCAR) 5 MG tablet Take 5 mg by mouth daily.     lisinopril (PRINIVIL,ZESTRIL) 20 MG tablet Take 20 mg by mouth daily.     metoprolol tartrate (LOPRESSOR) 25 MG tablet Take 12.5 mg by mouth 2 (two) times daily.      nitroGLYCERIN (NITROSTAT) 0.4 MG SL tablet PLACE 1 TABLET UNDER THE TONGUE AS NEEDED EVERY 5 MINUTES FOR CHEST PAIN-MAX 3 DOSES IN 15 MINUTES,IF NO RELIEF CALL 911 OR PRECRIBER 25 tablet 3   omeprazole (PRILOSEC) 20 MG capsule Take 20 mg by mouth 2 (two) times daily as needed.      No current facility-administered medications for this visit.    Allergies:  Plavix [clopidogrel]   Social History: The patient  reports that he quit smoking about 51 years ago. His smoking use included cigarettes. He has never used smokeless tobacco. He reports that he does not drink alcohol or use drugs.   ROS:  Please see the history of present illness. Otherwise, complete review of systems is positive for none.  All other systems are reviewed and negative.   Physical Exam: VS:  BP (!) 158/80    Pulse 84    Ht 5\' 6"  (1.676 m)    Wt 254 lb (115.2 kg)    SpO2 98%    BMI 41.00 kg/m , BMI Body mass index is 41 kg/m.  Wt Readings from Last  3 Encounters:  04/26/19 254 lb (115.2 kg)  09/01/18 256 lb (116.1 kg)  03/26/18 224 lb (101.6 kg)    General: Morbidly obese male, appears comfortable at rest. HEENT: Conjunctiva and lids normal, wearing a mask. Neck: Supple, no elevated JVP or carotid bruits, no thyromegaly. Lungs: Clear to auscultation, nonlabored breathing at rest. Cardiac: Regular rate and rhythm, no S3 or significant systolic murmur, no pericardial rub. Abdomen: Protuberant, nontender, bowel sounds present, no guarding or rebound. Extremities: No pitting edema, distal pulses 2+. Skin: Warm and dry. Musculoskeletal: No kyphosis. Neuropsychiatric: Alert and oriented x3, affect grossly appropriate.  ECG:  An ECG dated 09/01/2018 was personally reviewed today and demonstrated:  Sinus  rhythm with low voltage in the precordial leads, poor R wave progression, nonspecific T wave changes.  Recent Labwork:  September 2019: Hgb 13.9, platelets 204, TSH 2.26, cholesterol 202, TG 102, HDL 44, LDL 138, BUN 15, creatinine 0.9, potassium 5.0, AST 18, ALT 14  Other Studies Reviewed Today:  Cardiac catheterization 05/24/2017([facility]): FINDINGS Hemodynamics and Left Heart Catheterization  Aortic pressure: 138/80 mm Hg (mean 104 mm Hg)  Left ventricular filling pressure: elevated (LVEDP = 20 mm Hg). Left Ventriculogram  RAO Left Ventriculogram:Normal  Ejection Fraction (visual estimate): 55%  Mitral Regurgitation: None  Wall motion: Normal Coronary Angiography Dominance: Right Left Main: The left main coronary artery (LMCA) is a large-caliber vessel that originates from the left coronary sinus. It trifurcates into the left anterior descending (LAD), ramus intermedius (RI), and left circumflex (LCx) arteries. There is no angiographic evidence of significant disease in the LMCA. LAD: The LAD is a large-caliber vessel that gives off 2 diagonal (D) branches before it wraps around the apex. D1 is a small-caliber vessel. D2 is a very small-caliber vessel. There is moderate diffuse disease up to 40% in the LAD. Ramus Intermedius: The RI is a large-caliber vessel with moderate diffuse up to 40%. Left Circumflex: The LCx is a large-caliber vessel that gives off 2 obtuse marginal (OM) branches and then continues as a small vessel in the AV groove. OM1 is a very small-caliber vessel. OM2 is a small-caliber vessel. There is a mid LCx 100% chronic total occlusion. The distal LCx and OM2 fill via right-to-left collaterals. Right Coronary: The right coronary artery (RCA) is a large-caliber vessel originating from the right coronary sinus. It bifurcates distally into the posterior descending artery (PDA) and a posterolateral (PL) branch consistent with a right dominant system. There is mild to moderate  diffuse disease up to 30% in the RCA.  Assessment and Plan:  1.  CAD with history of occluded mid circumflex associated with right to left collaterals and otherwise mild to moderate residual disease that has been managed medically.  Cardiac catheterization from [facility] in November 2018 is reviewed above.  Plan is to continue aspirin, beta-blocker, ACE inhibitor, and as needed nitroglycerin.  He has a statin intolerance as noted above.  2.  Morbid obesity.  I talked with him about weight loss.  3.  Essential hypertension, systolic is in the [id number] today.  I asked him to track home blood pressures and bring results for his physical with PCP. Might consider addition of chlorthalidone if needed.  Weight loss and sodium restriction would also be beneficial.  4.  Mixed hyperlipidemia with statin intolerance, last LDL 138.  Follow-up lab work pending.  He did not want to consider PCSK9 inhibitor based on prior discussions.  Could consider Zetia or cholestyramine.  Medication Adjustments/Labs and Tests Ordered:  Current medicines are reviewed at length with the patient today.  Concerns regarding medicines are outlined above.   Tests Ordered: No orders of the defined types were placed in this encounter.   Medication Changes: No orders of the defined types were placed in this encounter.   Disposition:  Follow up 1 year in the [place] office.  Signed, Satira Sark, MD, [facility] 04/26/2019 12:10 PM    [place] [facility] [place], [place], [place] [address] Phone: [phone]; Fax: [phone]

## 2019-05-25 ENCOUNTER — Encounter: Payer: Self-pay | Admitting: Cardiology

## 2019-05-25 DIAGNOSIS — Z1211 Encounter for screening for malignant neoplasm of colon: Secondary | ICD-10-CM | POA: Diagnosis not present

## 2019-05-25 DIAGNOSIS — Z7189 Other specified counseling: Secondary | ICD-10-CM | POA: Diagnosis not present

## 2019-05-25 DIAGNOSIS — Z0000 Encounter for general adult medical examination without abnormal findings: Secondary | ICD-10-CM | POA: Diagnosis not present

## 2019-05-25 DIAGNOSIS — Z1339 Encounter for screening examination for other mental health and behavioral disorders: Secondary | ICD-10-CM | POA: Diagnosis not present

## 2019-05-25 DIAGNOSIS — Z125 Encounter for screening for malignant neoplasm of prostate: Secondary | ICD-10-CM | POA: Diagnosis not present

## 2019-05-25 DIAGNOSIS — Z1331 Encounter for screening for depression: Secondary | ICD-10-CM | POA: Diagnosis not present

## 2019-05-25 DIAGNOSIS — Z6841 Body Mass Index (BMI) 40.0 and over, adult: Secondary | ICD-10-CM | POA: Diagnosis not present

## 2019-05-25 DIAGNOSIS — Z299 Encounter for prophylactic measures, unspecified: Secondary | ICD-10-CM | POA: Diagnosis not present

## 2019-05-25 DIAGNOSIS — I10 Essential (primary) hypertension: Secondary | ICD-10-CM | POA: Diagnosis not present

## 2019-05-25 DIAGNOSIS — E7800 Pure hypercholesterolemia, unspecified: Secondary | ICD-10-CM | POA: Diagnosis not present

## 2019-05-25 DIAGNOSIS — Z79899 Other long term (current) drug therapy: Secondary | ICD-10-CM | POA: Diagnosis not present

## 2019-05-25 DIAGNOSIS — R5383 Other fatigue: Secondary | ICD-10-CM | POA: Diagnosis not present

## 2019-05-25 DIAGNOSIS — I2510 Atherosclerotic heart disease of native coronary artery without angina pectoris: Secondary | ICD-10-CM | POA: Diagnosis not present

## 2019-06-01 ENCOUNTER — Telehealth: Payer: Self-pay | Admitting: *Deleted

## 2019-06-01 NOTE — Telephone Encounter (Signed)
Wife informed since patient was in bed and says that Dr. Manuella Ghazi prescribed crestor 5 mg taking one weekly on last Thursday. Wife will advise patient to contact our office if he is willing to try zetia.

## 2019-06-01 NOTE — Telephone Encounter (Signed)
-----   Message from Satira Sark, MD sent at 05/29/2019  4:13 PM EST ----- Results reviewed.  Total cholesterol 226 with LDL 155.  He reports history of statin intolerance and was not enthusiastic about PCSK9 inhibitors previously.  Question whether he would consider starting Zetia 10 mg daily.

## 2019-08-18 DIAGNOSIS — H5213 Myopia, bilateral: Secondary | ICD-10-CM | POA: Diagnosis not present

## 2019-08-18 DIAGNOSIS — H524 Presbyopia: Secondary | ICD-10-CM | POA: Diagnosis not present

## 2019-08-18 DIAGNOSIS — H2513 Age-related nuclear cataract, bilateral: Secondary | ICD-10-CM | POA: Diagnosis not present

## 2019-08-18 DIAGNOSIS — H16223 Keratoconjunctivitis sicca, not specified as Sjogren's, bilateral: Secondary | ICD-10-CM | POA: Diagnosis not present

## 2019-08-31 DIAGNOSIS — M549 Dorsalgia, unspecified: Secondary | ICD-10-CM | POA: Diagnosis not present

## 2019-08-31 DIAGNOSIS — Z6841 Body Mass Index (BMI) 40.0 and over, adult: Secondary | ICD-10-CM | POA: Diagnosis not present

## 2019-08-31 DIAGNOSIS — I10 Essential (primary) hypertension: Secondary | ICD-10-CM | POA: Diagnosis not present

## 2019-08-31 DIAGNOSIS — Z299 Encounter for prophylactic measures, unspecified: Secondary | ICD-10-CM | POA: Diagnosis not present

## 2019-08-31 DIAGNOSIS — Z79899 Other long term (current) drug therapy: Secondary | ICD-10-CM | POA: Diagnosis not present

## 2019-08-31 DIAGNOSIS — E7800 Pure hypercholesterolemia, unspecified: Secondary | ICD-10-CM | POA: Diagnosis not present

## 2019-08-31 DIAGNOSIS — Z87891 Personal history of nicotine dependence: Secondary | ICD-10-CM | POA: Diagnosis not present

## 2019-09-07 ENCOUNTER — Telehealth: Payer: Self-pay | Admitting: *Deleted

## 2019-09-07 NOTE — Telephone Encounter (Signed)
Wife informed. Reports patient is currently taking crestor 5 mg by mouth weekly.

## 2019-09-07 NOTE — Telephone Encounter (Signed)
-----   Message from Satira Sark, MD sent at 09/07/2019  9:00 AM EST ----- Noted.  If he is tolerating current dosing, consider increasing Crestor 5 mg twice weekly.  It would be good if we can get his LDL down closer to 70.

## 2019-09-07 NOTE — Telephone Encounter (Signed)
Wife informed and verbalized understanding

## 2019-09-07 NOTE — Telephone Encounter (Signed)
-----   Message from Satira Sark, MD sent at 09/05/2019  4:14 PM EST ----- Results reviewed.  In general cholesterol numbers look better, LDL down to 112, previously in the 150s.  Please find out what he is taking now for his lipid regimen.  I think it has been adjusted by Dr. Manuella Ghazi.

## 2019-10-04 DIAGNOSIS — H02831 Dermatochalasis of right upper eyelid: Secondary | ICD-10-CM | POA: Diagnosis not present

## 2019-10-04 DIAGNOSIS — H02834 Dermatochalasis of left upper eyelid: Secondary | ICD-10-CM | POA: Diagnosis not present

## 2019-10-04 DIAGNOSIS — H25811 Combined forms of age-related cataract, right eye: Secondary | ICD-10-CM | POA: Diagnosis not present

## 2019-10-04 DIAGNOSIS — H25813 Combined forms of age-related cataract, bilateral: Secondary | ICD-10-CM | POA: Diagnosis not present

## 2019-10-05 NOTE — H&P (Signed)
Surgical History & Physical  Patient Name: Melvin Harris DOB: 1946-03-25  Surgery: Cataract extraction with intraocular lens implant phacoemulsification; Right Eye  Surgeon: Baruch Goldmann MD Surgery Date:  10/11/2019 Pre-Op Date:  10/04/2019  HPI: A 23 Yr. old male patient 1. The patient complains of difficulty when viewing TV, reading closed caption, news scrolls on TV, which began 1 month ago. The right eye is affected. The episode is gradual. The condition's severity increased since last visit. Symptoms occur when the patient is inside and outside. The complaint is associated with glare. HPI was performed by Baruch Goldmann .  Medical History: Cataracts Arthritis Hearing loss, GERD Heart Problem High Blood Pressure  Review of Systems Negative Allergic/Immunologic Negative Cardiovascular Negative Constitutional Negative Ear, Nose, Mouth & Throat Negative Endocrine Negative Eyes Negative Gastrointestinal Negative Genitourinary Negative Hemotologic/Lymphatic Negative Integumentary Negative Musculoskeletal Negative Neurological Negative Psychiatry Negative Respiratory  Social   Never smoked    Medication Lisinopril, Metoprolol, Omeprazole,   Sx/Procedures  None  Drug Allergies  Coumadin,   History & Physical: Heent:  Cataract, Right eye NECK: supple without bruits LUNGS: lungs clear to auscultation CV: regular rate and rhythm Abdomen: soft and non-tender  Impression & Plan: Assessment: 1.  COMBINED FORMS AGE RELATED CATARACT; Both Eyes (H25.813) 2.  DERMATOCHALASIS, no surgery; Right Upper Lid, Left Upper Lid (H02.831, [id number])  Plan: 1.  Cataract accounts for the patient's decreased vision. This visual impairment is not correctable with a tolerable change in glasses or contact lenses. Cataract surgery with an implantation of a new lens should significantly improve the visual and functional status of the patient. Discussed all risks, benefits, alternatives, and  potential complications. Discussed the procedures and recovery. Patient desires to have surgery. A-scan ordered and performed today for intra-ocular lens calculations. The surgery will be performed in order to improve vision for driving, reading, and for eye examinations. Recommend phacoemulsification with intra-ocular lens. Right Eye worse - first. Dilates well - shugarcaine by protocol. 2.  Asymptomatic, recommend observation for now. Findings, prognosis and treatment options reviewed.

## 2019-10-08 ENCOUNTER — Encounter (HOSPITAL_COMMUNITY)
Admission: RE | Admit: 2019-10-08 | Discharge: 2019-10-08 | Disposition: A | Discharge status: Home / Self Care | Payer: Medicare Other | Source: Ambulatory Visit | Attending: Ophthalmology | Admitting: Ophthalmology

## 2019-10-08 ENCOUNTER — Other Ambulatory Visit: Payer: Self-pay

## 2019-10-08 ENCOUNTER — Other Ambulatory Visit (HOSPITAL_COMMUNITY)
Admission: RE | Admit: 2019-10-08 | Discharge: 2019-10-08 | Disposition: A | Discharge status: Home / Self Care | Payer: Medicare Other | Source: Ambulatory Visit | Attending: Ophthalmology | Admitting: Ophthalmology

## 2019-10-08 DIAGNOSIS — Z01812 Encounter for preprocedural laboratory examination: Secondary | ICD-10-CM | POA: Insufficient documentation

## 2019-10-08 DIAGNOSIS — Z20822 Contact with and (suspected) exposure to covid-19: Secondary | ICD-10-CM | POA: Diagnosis not present

## 2019-10-08 MED ORDER — TETRACAINE HCL 0.5 % OP SOLN
OPHTHALMIC | Status: AC
  Filled 2019-10-08: qty 4

## 2019-10-08 MED ORDER — LIDOCAINE HCL (PF) 1 % IJ SOLN
INTRAMUSCULAR | Status: AC
  Filled 2019-10-08: qty 2

## 2019-10-08 MED ORDER — PHENYLEPHRINE HCL 2.5 % OP SOLN
OPHTHALMIC | Status: AC
  Filled 2019-10-08: qty 15

## 2019-10-08 MED ORDER — CYCLOPENTOLATE-PHENYLEPHRINE 0.2-1 % OP SOLN
OPHTHALMIC | Status: AC
  Filled 2019-10-08: qty 2

## 2019-10-08 MED ORDER — LIDOCAINE HCL 3.5 % OP GEL
OPHTHALMIC | Status: AC
  Filled 2019-10-08: qty 1

## 2019-10-08 MED ORDER — NEOMYCIN-POLYMYXIN-DEXAMETH 3.5-10000-0.1 OP SUSP
OPHTHALMIC | Status: AC
  Filled 2019-10-08: qty 5

## 2019-10-09 LAB — SARS CORONAVIRUS 2 (TAT 6-24 HRS): SARS Coronavirus 2: NEGATIVE

## 2019-10-11 ENCOUNTER — Ambulatory Visit (HOSPITAL_COMMUNITY): Payer: Medicare Other | Admitting: Anesthesiology

## 2019-10-11 ENCOUNTER — Encounter (HOSPITAL_COMMUNITY): Payer: Self-pay | Admitting: Ophthalmology

## 2019-10-11 ENCOUNTER — Ambulatory Visit (HOSPITAL_COMMUNITY)
Admission: RE | Admit: 2019-10-11 | Discharge: 2019-10-11 | Disposition: A | Discharge status: Home / Self Care | Payer: Medicare Other | Attending: Ophthalmology | Admitting: Ophthalmology

## 2019-10-11 ENCOUNTER — Encounter (HOSPITAL_COMMUNITY)
Admission: RE | Disposition: A | Discharge status: Home / Self Care | Payer: Self-pay | Source: Home / Self Care | Attending: Ophthalmology

## 2019-10-11 DIAGNOSIS — I2510 Atherosclerotic heart disease of native coronary artery without angina pectoris: Secondary | ICD-10-CM | POA: Diagnosis not present

## 2019-10-11 DIAGNOSIS — I252 Old myocardial infarction: Secondary | ICD-10-CM | POA: Diagnosis not present

## 2019-10-11 DIAGNOSIS — H25813 Combined forms of age-related cataract, bilateral: Secondary | ICD-10-CM | POA: Diagnosis not present

## 2019-10-11 DIAGNOSIS — M1990 Unspecified osteoarthritis, unspecified site: Secondary | ICD-10-CM | POA: Diagnosis not present

## 2019-10-11 DIAGNOSIS — H02831 Dermatochalasis of right upper eyelid: Secondary | ICD-10-CM | POA: Diagnosis not present

## 2019-10-11 DIAGNOSIS — M549 Dorsalgia, unspecified: Secondary | ICD-10-CM | POA: Insufficient documentation

## 2019-10-11 DIAGNOSIS — K219 Gastro-esophageal reflux disease without esophagitis: Secondary | ICD-10-CM | POA: Insufficient documentation

## 2019-10-11 DIAGNOSIS — Z87891 Personal history of nicotine dependence: Secondary | ICD-10-CM | POA: Insufficient documentation

## 2019-10-11 DIAGNOSIS — I10 Essential (primary) hypertension: Secondary | ICD-10-CM | POA: Insufficient documentation

## 2019-10-11 DIAGNOSIS — G709 Myoneural disorder, unspecified: Secondary | ICD-10-CM | POA: Diagnosis not present

## 2019-10-11 DIAGNOSIS — H25811 Combined forms of age-related cataract, right eye: Secondary | ICD-10-CM | POA: Diagnosis not present

## 2019-10-11 DIAGNOSIS — Z888 Allergy status to other drugs, medicaments and biological substances status: Secondary | ICD-10-CM | POA: Insufficient documentation

## 2019-10-11 DIAGNOSIS — Z79899 Other long term (current) drug therapy: Secondary | ICD-10-CM | POA: Diagnosis not present

## 2019-10-11 DIAGNOSIS — H02834 Dermatochalasis of left upper eyelid: Secondary | ICD-10-CM | POA: Insufficient documentation

## 2019-10-11 HISTORY — PX: CATARACT EXTRACTION W/PHACO: SHX586

## 2019-10-11 SURGERY — PHACOEMULSIFICATION, CATARACT, WITH IOL INSERTION
Anesthesia: Monitor Anesthesia Care | Site: Eye | Laterality: Right

## 2019-10-11 MED ORDER — DEXAMETHASONE 0.4 MG OP INST
VAGINAL_INSERT | OPHTHALMIC | Status: AC
  Filled 2019-10-11: qty 1

## 2019-10-11 MED ORDER — EPINEPHRINE PF 1 MG/ML IJ SOLN
INTRAOCULAR | Status: DC | PRN
  Administered 2019-10-11: 08:00:00 500 mL

## 2019-10-11 MED ORDER — PROVISC 10 MG/ML IO SOLN
INTRAOCULAR | Status: DC | PRN
  Administered 2019-10-11: 0.85 mL via INTRAOCULAR

## 2019-10-11 MED ORDER — SODIUM HYALURONATE 23 MG/ML IO SOLN
INTRAOCULAR | Status: DC | PRN
  Administered 2019-10-11: 0.6 mL via INTRAOCULAR

## 2019-10-11 MED ORDER — BSS IO SOLN
INTRAOCULAR | Status: DC | PRN
  Administered 2019-10-11: 15 mL via INTRAOCULAR

## 2019-10-11 MED ORDER — POVIDONE-IODINE 5 % OP SOLN
OPHTHALMIC | Status: DC | PRN
  Administered 2019-10-11: 1 via OPHTHALMIC

## 2019-10-11 MED ORDER — LIDOCAINE HCL (PF) 1 % IJ SOLN
INTRAOCULAR | Status: DC | PRN
  Administered 2019-10-11: 08:00:00 1 mL via OPHTHALMIC

## 2019-10-11 MED ORDER — CYCLOPENTOLATE-PHENYLEPHRINE 0.2-1 % OP SOLN
1.0000 [drp] | OPHTHALMIC | Status: AC | PRN
  Administered 2019-10-11 (×3): 1 [drp] via OPHTHALMIC

## 2019-10-11 MED ORDER — LIDOCAINE HCL 3.5 % OP GEL
1.0000 "application " | Freq: Once | OPHTHALMIC | Status: AC
  Administered 2019-10-11: 1 via OPHTHALMIC

## 2019-10-11 MED ORDER — TETRACAINE HCL 0.5 % OP SOLN
1.0000 [drp] | OPHTHALMIC | Status: AC | PRN
  Administered 2019-10-11 (×3): 1 [drp] via OPHTHALMIC

## 2019-10-11 MED ORDER — PHENYLEPHRINE HCL 2.5 % OP SOLN
1.0000 [drp] | OPHTHALMIC | Status: AC | PRN
  Administered 2019-10-11 (×3): 1 [drp] via OPHTHALMIC

## 2019-10-11 MED ORDER — EPINEPHRINE PF 1 MG/ML IJ SOLN
INTRAMUSCULAR | Status: AC
  Filled 2019-10-11: qty 2

## 2019-10-11 MED ORDER — DEXAMETHASONE 0.4 MG OP INST
VAGINAL_INSERT | OPHTHALMIC | Status: DC | PRN
  Administered 2019-10-11: 0.4 mg via OPHTHALMIC

## 2019-10-11 SURGICAL SUPPLY — 13 items

## 2019-10-11 NOTE — Transfer of Care (Signed)
Immediate Anesthesia Transfer of Care Note  Patient: Melvin Harris  Procedure(s) Performed: CATARACT EXTRACTION PHACO AND INTRAOCULAR LENS PLACEMENT (IOC) (Right Eye)  Patient Location: Short Stay  Anesthesia Type:MAC  Level of Consciousness: awake, alert , oriented and patient cooperative  Airway & Oxygen Therapy: Patient Spontanous Breathing  Post-op Assessment: Report given to RN and Post -op Vital signs reviewed and stable  Post vital signs: Reviewed and stable  Last Vitals:  Vitals Value Taken Time  BP    Temp    Pulse    Resp    SpO2      Last Pain:  Vitals:   10/11/19 0651  TempSrc: Oral  PainSc: 0-No pain      Patients Stated Pain Goal: 8 (89/84/21 0312)  Complications: No apparent anesthesia complications

## 2019-10-11 NOTE — Discharge Instructions (Signed)
Please discharge patient when stable, will follow up today with Dr. Rondarius Kadrmas at the [facility] office immediately following discharge.  Leave shield in place until visit.  All paperwork with discharge instructions will be given at the office.  [facility] Address:  [address]  [place], [place] [address]  

## 2019-10-11 NOTE — Anesthesia Preprocedure Evaluation (Addendum)
Anesthesia Evaluation  Patient identified by MRN, date of birth, ID band Patient awake    Reviewed: Allergy & Precautions, NPO status , Patient's Chart, lab work & pertinent test results, reviewed documented beta blocker date and time   History of Anesthesia Complications Negative for: history of anesthetic complications  Airway Mallampati: III  TM Distance: >3 FB Neck ROM: Full    Dental  (+) Missing, Dental Advisory Given   Pulmonary neg pulmonary ROS, former smoker,    breath sounds clear to auscultation       Cardiovascular Exercise Tolerance: Good hypertension, Pt. on medications and Pt. on home beta blockers + CAD (Medical management) and + Past MI (2018)   Rhythm:Regular Rate:Normal + Systolic murmurs Sinus  Rhythm  Low voltage in precordial leads.   -Poor R wave progression  -  Nonspecific T-abnormality.    Neuro/Psych  Neuromuscular disease negative psych ROS   GI/Hepatic Neg liver ROS, GERD  Medicated and Controlled,  Endo/Other  Morbid obesity  Renal/GU negative Renal ROS     Musculoskeletal  (+) Arthritis  (back pain), Osteoarthritis,    Abdominal   Peds negative pediatric ROS (+)  Hematology   Anesthesia Other Findings   Reproductive/Obstetrics                           Anesthesia Physical Anesthesia Plan  ASA: III  Anesthesia Plan: MAC   Post-op Pain Management:    Induction:   PONV Risk Score and Plan: 1 and Treatment may vary due to age or medical condition  Airway Management Planned: Nasal Cannula and Natural Airway  Additional Equipment:   Intra-op Plan:   Post-operative Plan:   Informed Consent: I have reviewed the patients History and Physical, chart, labs and discussed the procedure including the risks, benefits and alternatives for the proposed anesthesia with the patient or authorized representative who has indicated his/her understanding and  acceptance.     Dental advisory given  Plan Discussed with: CRNA  Anesthesia Plan Comments:         Anesthesia Quick Evaluation

## 2019-10-11 NOTE — Op Note (Addendum)
Date of procedure: 10/11/19  Pre-operative diagnosis:  Visually significant combined form age-related cataract, Right Eye (H25.811)  Post-operative diagnosis:  Visually significant combined form age-related cataract, Right Eye (H25.811)  Procedure:  1. Removal of cataract via phacoemulsification and insertion of intra-ocular lens [place]  +20.5D into the capsular bag of the Right Eye 2. Placement of Dextenza Implant, Right Lower Eyelid    Attending surgeon: Gerda Diss. Cylinda Santoli, MD, MA  Anesthesia: MAC, Topical [place]  Complications: None  Estimated Blood Loss: <67m (minimal)  Specimens: None  Implants: As above  Indications:  Visually significant age-related cataract, Right Eye  Procedure:  The patient was seen and identified in the pre-operative area. The operative eye was identified and dilated.  The operative eye was marked.  Topical anesthesia was administered to the operative eye.     The patient was then to the operative suite and placed in the supine position.  A timeout was performed confirming the patient, procedure to be performed, and all other relevant information.   The patient's face was prepped and draped in the usual fashion for intra-ocular surgery.  A lid speculum was placed into the operative eye and the surgical microscope moved into place and focused.  A superotemporal paracentesis was created using a 20 gauge paracentesis blade.  Shugarcaine was injected into the anterior chamber.  Viscoelastic was injected into the anterior chamber.  A temporal clear-corneal main wound incision was created using a 2.410mmicrokeratome.  A continuous curvilinear capsulorrhexis was initiated using an irrigating cystitome and completed using capsulorrhexis forceps.  Hydrodissection and hydrodeliniation were performed.  Viscoelastic was injected into the anterior chamber.  A phacoemulsification handpiece and a chopper as a second instrument were used to remove the  nucleus and epinucleus. The irrigation/aspiration handpiece was used to remove any remaining cortical material.   The capsular bag was reinflated with viscoelastic, checked, and found to be intact.  The intraocular lens was inserted into the capsular bag.  The irrigation/aspiration handpiece was used to remove any remaining viscoelastic.  The clear corneal wound and paracentesis wounds were then hydrated and checked with Weck-Cels to be watertight.  The lid-speculum and drape was removed. A Dextenza implant was placed in the lower canaliculus. The patient's face was cleaned with a wet and dry 4x4.  Maxitrol was instilled in the eye before a clear shield was taped over the eye. The patient was taken to the post-operative care unit in good condition, having tolerated the procedure well.  Post-Op Instructions: [facility] and will receive all other orders and instructions.

## 2019-10-11 NOTE — Interval H&P Note (Signed)
History and Physical Interval Note: The H and P was reviewed and updated. The patient was examined.  No changes were found after exam.  The surgical eye was marked.  10/11/2019 7:56 AM  Melvin Harris  has presented today for surgery, with the diagnosis of nuclear cataract right eye.  The various methods of treatment have been discussed with the patient and family. After consideration of risks, benefits and other options for treatment, the patient has consented to  Procedure(s) with comments: CATARACT EXTRACTION PHACO AND INTRAOCULAR LENS PLACEMENT (IOC) (Right) - right as a surgical intervention.  The patient's history has been reviewed, patient examined, no change in status, stable for surgery.  I have reviewed the patient's chart and labs.  Questions were answered to the patient's satisfaction.     Baruch Goldmann

## 2019-10-11 NOTE — Anesthesia Postprocedure Evaluation (Signed)
Anesthesia Post Note  Patient: Melvin Harris  Procedure(s) Performed: CATARACT EXTRACTION PHACO AND INTRAOCULAR LENS PLACEMENT ([place]) (Right Eye)  Patient location during evaluation: Short Stay Anesthesia Type: MAC Level of consciousness: awake and alert Pain management: pain level controlled Vital Signs Assessment: post-procedure vital signs reviewed and stable Respiratory status: spontaneous breathing Cardiovascular status: stable Postop Assessment: adequate PO intake and no apparent nausea or vomiting Anesthetic complications: no     Last Vitals:  Vitals:   10/11/19 0651 10/11/19 0829  BP: (!) 153/84 (!) 147/81  Pulse:  67  Resp: (!) 22 18  Temp: 37 C 36.8 C  SpO2: 96% 96%    Last Pain:  Vitals:   10/11/19 0829  TempSrc: Oral  PainSc: 0-No pain                 Everette Rank

## 2019-10-28 NOTE — H&P (Signed)
Surgical History & Physical  Patient Name: Melvin Harris DOB: July 18, 1945  Surgery: Cataract extraction with intraocular lens implant phacoemulsification; Left Eye  Surgeon: Baruch Goldmann MD Surgery Date:  11/08/2019 Pre-Op Date:  10/18/2019  HPI: A 66 Yr. old male patient 1. 1. The patient is returning after cataract surgery. The right eye is affected. Status post cataract surgery on 10-11-2019: Onset was S/P STD/Dextenza. Since the last visit, the affected area feels improvement and is doing well. The patient's vision is improved. Patient is following medication instructions with 3 in 1 drops as directed TID. No pain or discomfort. VA much clearer and brighter since sx OD. OS dull and yellow c/w OD. This is negatively affecting the patient's quality of life. Patient eager to proceed with OS for clarity and brightness. Patient does admit able to go without glasses with current VA. ? option with sx.  Medical History: Cataracts Arthritis Hearing loss GERD Heart Problem High Blood Pressure  Review of Systems Negative Allergic/Immunologic Negative Cardiovascular Negative Constitutional Negative Ear, Nose, Mouth & Throat Negative Endocrine Negative Eyes Negative Gastrointestinal Negative Genitourinary Negative Hemotologic/Lymphatic Negative Integumentary Negative Musculoskeletal Negative Neurological Negative Psychiatry Negative Respiratory  Social   Never smoked  Medication Prednisolone-gatiflox-bromfenac,  Lisinopril, Metoprolol, Omeprazole,   Sx/Procedures Phaco c IOL OD with Dextenza,   Drug Allergies  Coumadin,   History & Physical: Heent:  Cataract, Left eye NECK: supple without bruits LUNGS: lungs clear to auscultation CV: regular rate and rhythm Abdomen: soft and non-tender  Impression & Plan: Assessment: 1.  CATARACT EXTRACTION STATUS; Right Eye (Z98.41) 2.  COMBINED FORMS AGE RELATED CATARACT; Left Eye (H25.812)  Plan: 1.  1 week after cataract surgery.  Doing well with improved vision and normal eye pressure. Call with any problems or concerns. Dextenza. Stop all drops. Call with any concerning symptoms. 2.  Cataract accounts for the patient's decreased vision. This visual impairment is not correctable with a tolerable change in glasses or contact lenses. Cataract surgery with an implantation of a new lens should significantly improve the visual and functional status of the patient. Discussed all risks, benefits, alternatives, and potential complications. Discussed the procedures and recovery. Patient desires to have surgery. A-scan ordered and performed today for intra-ocular lens calculations. The surgery will be performed in order to improve vision for driving, reading, and for eye examinations. Recommend phacoemulsification with intra-ocular lens. Left Eye. Surgery required to correct imbalance of vision. Dilates well - shugarcaine by protocol. Dextenza.

## 2019-11-01 DIAGNOSIS — H25812 Combined forms of age-related cataract, left eye: Secondary | ICD-10-CM | POA: Diagnosis not present

## 2019-11-03 ENCOUNTER — Encounter (HOSPITAL_COMMUNITY): Payer: Self-pay

## 2019-11-04 ENCOUNTER — Other Ambulatory Visit: Payer: Self-pay

## 2019-11-04 ENCOUNTER — Encounter (HOSPITAL_COMMUNITY)
Admission: RE | Admit: 2019-11-04 | Discharge: 2019-11-04 | Disposition: A | Discharge status: Home / Self Care | Payer: Medicare Other | Source: Ambulatory Visit | Attending: Ophthalmology | Admitting: Ophthalmology

## 2019-11-05 ENCOUNTER — Other Ambulatory Visit: Payer: Self-pay

## 2019-11-05 ENCOUNTER — Other Ambulatory Visit (HOSPITAL_COMMUNITY)
Admission: RE | Admit: 2019-11-05 | Discharge: 2019-11-05 | Disposition: A | Discharge status: Home / Self Care | Payer: Medicare Other | Source: Ambulatory Visit | Attending: Ophthalmology | Admitting: Ophthalmology

## 2019-11-05 DIAGNOSIS — Z01812 Encounter for preprocedural laboratory examination: Secondary | ICD-10-CM | POA: Diagnosis not present

## 2019-11-05 DIAGNOSIS — Z20822 Contact with and (suspected) exposure to covid-19: Secondary | ICD-10-CM | POA: Diagnosis not present

## 2019-11-06 LAB — SARS CORONAVIRUS 2 (TAT 6-24 HRS): SARS Coronavirus 2: NEGATIVE

## 2019-11-08 ENCOUNTER — Ambulatory Visit (HOSPITAL_COMMUNITY)
Admission: RE | Admit: 2019-11-08 | Discharge: 2019-11-08 | Disposition: A | Discharge status: Home / Self Care | Payer: Medicare Other | Attending: Ophthalmology | Admitting: Ophthalmology

## 2019-11-08 ENCOUNTER — Ambulatory Visit (HOSPITAL_COMMUNITY): Payer: Medicare Other | Admitting: Anesthesiology

## 2019-11-08 ENCOUNTER — Encounter (HOSPITAL_COMMUNITY)
Admission: RE | Disposition: A | Discharge status: Home / Self Care | Payer: Self-pay | Source: Home / Self Care | Attending: Ophthalmology

## 2019-11-08 ENCOUNTER — Encounter (HOSPITAL_COMMUNITY): Payer: Self-pay | Admitting: Ophthalmology

## 2019-11-08 DIAGNOSIS — H25812 Combined forms of age-related cataract, left eye: Secondary | ICD-10-CM | POA: Diagnosis not present

## 2019-11-08 DIAGNOSIS — I10 Essential (primary) hypertension: Secondary | ICD-10-CM | POA: Diagnosis not present

## 2019-11-08 DIAGNOSIS — M1990 Unspecified osteoarthritis, unspecified site: Secondary | ICD-10-CM | POA: Diagnosis not present

## 2019-11-08 DIAGNOSIS — I252 Old myocardial infarction: Secondary | ICD-10-CM | POA: Diagnosis not present

## 2019-11-08 DIAGNOSIS — I2510 Atherosclerotic heart disease of native coronary artery without angina pectoris: Secondary | ICD-10-CM | POA: Diagnosis not present

## 2019-11-08 HISTORY — PX: CATARACT EXTRACTION W/PHACO: SHX586

## 2019-11-08 SURGERY — PHACOEMULSIFICATION, CATARACT, WITH IOL INSERTION
Anesthesia: Monitor Anesthesia Care | Site: Eye | Laterality: Left

## 2019-11-08 MED ORDER — CYCLOPENTOLATE-PHENYLEPHRINE 0.2-1 % OP SOLN
1.0000 [drp] | OPHTHALMIC | Status: AC | PRN
  Administered 2019-11-08 (×3): 1 [drp] via OPHTHALMIC

## 2019-11-08 MED ORDER — PROVISC 10 MG/ML IO SOLN
INTRAOCULAR | Status: DC | PRN
  Administered 2019-11-08: 0.85 mL via INTRAOCULAR

## 2019-11-08 MED ORDER — DEXAMETHASONE 0.4 MG OP INST
VAGINAL_INSERT | OPHTHALMIC | Status: DC | PRN
  Administered 2019-11-08: 0.4 mg via OPHTHALMIC

## 2019-11-08 MED ORDER — PHENYLEPHRINE HCL 2.5 % OP SOLN
1.0000 [drp] | OPHTHALMIC | Status: AC | PRN
  Administered 2019-11-08 (×3): 1 [drp] via OPHTHALMIC

## 2019-11-08 MED ORDER — SODIUM HYALURONATE 23 MG/ML IO SOLN
INTRAOCULAR | Status: DC | PRN
  Administered 2019-11-08: 0.6 mL via INTRAOCULAR

## 2019-11-08 MED ORDER — EPINEPHRINE PF 1 MG/ML IJ SOLN
INTRAOCULAR | Status: DC | PRN
  Administered 2019-11-08: 500 mL

## 2019-11-08 MED ORDER — LIDOCAINE HCL (PF) 1 % IJ SOLN
INTRAOCULAR | Status: DC | PRN
  Administered 2019-11-08: 1 mL via OPHTHALMIC

## 2019-11-08 MED ORDER — BSS IO SOLN
INTRAOCULAR | Status: DC | PRN
  Administered 2019-11-08: 15 mL via INTRAOCULAR

## 2019-11-08 MED ORDER — LIDOCAINE HCL 3.5 % OP GEL
1.0000 "application " | Freq: Once | OPHTHALMIC | Status: AC
  Administered 2019-11-08: 1 via OPHTHALMIC

## 2019-11-08 MED ORDER — POVIDONE-IODINE 5 % OP SOLN
OPHTHALMIC | Status: DC | PRN
  Administered 2019-11-08: 1 via OPHTHALMIC

## 2019-11-08 MED ORDER — TETRACAINE HCL 0.5 % OP SOLN
1.0000 [drp] | OPHTHALMIC | Status: AC | PRN
  Administered 2019-11-08 (×3): 1 [drp] via OPHTHALMIC

## 2019-11-08 SURGICAL SUPPLY — 19 items
CLOTH BEACON ORANGE TIMEOUT ST (SAFETY) ×2 IMPLANT
DEVICE MILOOP (MISCELLANEOUS) IMPLANT
EYE SHIELD UNIVERSAL CLEAR (GAUZE/BANDAGES/DRESSINGS) ×2 IMPLANT
GLOVE BIOGEL PI IND STRL 6.5 (GLOVE) IMPLANT
GLOVE BIOGEL PI IND STRL 7.0 (GLOVE) IMPLANT
GLOVE BIOGEL PI INDICATOR 6.5 (GLOVE)
GLOVE BIOGEL PI INDICATOR 7.0 (GLOVE) ×4
LENS ALC ACRYL/TECN (Ophthalmic Related) ×2 IMPLANT
MILOOP DEVICE (MISCELLANEOUS)
NDL HYPO 18GX1.5 BLUNT FILL (NEEDLE) IMPLANT
NEEDLE HYPO 18GX1.5 BLUNT FILL (NEEDLE) ×3 IMPLANT
PAD ARMBOARD 7.5X6 YLW CONV (MISCELLANEOUS) ×2 IMPLANT
RING MALYGIN (MISCELLANEOUS) IMPLANT
RING MALYGIN 7.0 (MISCELLANEOUS) IMPLANT
SYR TB 1ML LL NO SAFETY (SYRINGE) ×2 IMPLANT
TAPE SURG TRANSPORE 1 IN (GAUZE/BANDAGES/DRESSINGS) IMPLANT
TAPE SURGICAL TRANSPORE 1 IN (GAUZE/BANDAGES/DRESSINGS) ×2
VISCOELASTIC ADDITIONAL (OPHTHALMIC RELATED) ×2 IMPLANT
WATER STERILE IRR 250ML POUR (IV SOLUTION) ×2 IMPLANT

## 2019-11-08 NOTE — Interval H&P Note (Signed)
History and Physical Interval Note: The H and P was reviewed and updated. The patient was examined.  No changes were found after exam.  The surgical eye was marked.  11/08/2019 11:45 AM  Melvin Harris  has presented today for surgery, with the diagnosis of Nuclear sclerotic cataract - Left eye.  The various methods of treatment have been discussed with the patient and family. After consideration of risks, benefits and other options for treatment, the patient has consented to  Procedure(s) with comments: CATARACT EXTRACTION PHACO AND INTRAOCULAR LENS PLACEMENT ([place]) (Left) - left as a surgical intervention.  The patient's history has been reviewed, patient examined, no change in status, stable for surgery.  I have reviewed the patient's chart and labs.  Questions were answered to the patient's satisfaction.     Baruch Goldmann

## 2019-11-08 NOTE — Anesthesia Preprocedure Evaluation (Signed)
Anesthesia Evaluation  Patient identified by MRN, date of birth, ID band Patient awake    Reviewed: Allergy & Precautions, NPO status , Patient's Chart, lab work & pertinent test results, reviewed documented beta blocker date and time   History of Anesthesia Complications Negative for: history of anesthetic complications  Airway Mallampati: III  TM Distance: >3 FB Neck ROM: Full    Dental  (+) Missing, Dental Advisory Given   Pulmonary neg pulmonary ROS, former smoker,    breath sounds clear to auscultation       Cardiovascular Exercise Tolerance: Good hypertension, Pt. on medications and Pt. on home beta blockers + CAD (Medical management) and + Past MI (2018)   Rhythm:Regular Rate:Normal + Systolic murmurs Sinus  Rhythm  Low voltage in precordial leads.   -Poor R wave progression  -  Nonspecific T-abnormality.    Neuro/Psych  Neuromuscular disease negative psych ROS   GI/Hepatic Neg liver ROS, GERD  Medicated and Controlled,  Endo/Other  Morbid obesity  Renal/GU negative Renal ROS     Musculoskeletal  (+) Arthritis  (back pain), Osteoarthritis,    Abdominal   Peds negative pediatric ROS (+)  Hematology   Anesthesia Other Findings   Reproductive/Obstetrics                             Anesthesia Physical  Anesthesia Plan  ASA: III  Anesthesia Plan: MAC   Post-op Pain Management:    Induction:   PONV Risk Score and Plan: 1 and Treatment may vary due to age or medical condition  Airway Management Planned: Nasal Cannula and Natural Airway  Additional Equipment:   Intra-op Plan:   Post-operative Plan:   Informed Consent: I have reviewed the patients History and Physical, chart, labs and discussed the procedure including the risks, benefits and alternatives for the proposed anesthesia with the patient or authorized representative who has indicated his/her understanding and  acceptance.     Dental advisory given  Plan Discussed with: CRNA and Surgeon  Anesthesia Plan Comments:         Anesthesia Quick Evaluation

## 2019-11-08 NOTE — Anesthesia Postprocedure Evaluation (Signed)
Anesthesia Post Note  Patient: Melvin Harris  Procedure(s) Performed: CATARACT EXTRACTION PHACO AND INTRAOCULAR LENS PLACEMENT ([place]) CDE: 20.10 (Left Eye)  Patient location during evaluation: Short Stay Anesthesia Type: MAC Level of consciousness: awake and alert and oriented Pain management: pain level controlled Vital Signs Assessment: post-procedure vital signs reviewed and stable Respiratory status: spontaneous breathing Cardiovascular status: blood pressure returned to baseline and stable Postop Assessment: no apparent nausea or vomiting Anesthetic complications: no     Last Vitals:  Vitals:   11/08/19 1125 11/08/19 1132  BP: (!) 157/86   Pulse: 61   Resp: 16   Temp: 36.8 C   SpO2: 96% 98%    Last Pain:  Vitals:   11/08/19 1125  PainSc: 0-No pain                 Aislee Landgren

## 2019-11-08 NOTE — Discharge Instructions (Signed)
Please discharge patient when stable, will follow up today with Dr. Clement Deneault at the [facility] [place] office immediately following discharge.  Leave shield in place until visit.  All paperwork with discharge instructions will be given at the office.  [facility] Address:  [address]  [place], [place] [address]  

## 2019-11-08 NOTE — Transfer of Care (Signed)
Immediate Anesthesia Transfer of Care Note  Patient: Melvin Harris  Procedure(s) Performed: CATARACT EXTRACTION PHACO AND INTRAOCULAR LENS PLACEMENT ([facility]) CDE: 20.10 (Left Eye)  Patient Location: Short Stay  Anesthesia Type:MAC  Level of Consciousness: awake  Airway & Oxygen Therapy: Patient Spontanous Breathing  Post-op Assessment: Report given to RN  Post vital signs: Reviewed  Last Vitals:  Vitals Value Taken Time  BP    Temp    Pulse    Resp    SpO2      Last Pain:  Vitals:   11/08/19 1125  PainSc: 0-No pain         Complications: No apparent anesthesia complications

## 2019-11-08 NOTE — Op Note (Signed)
Date of procedure: 11/08/19  Pre-operative diagnosis: Visually significant age-related combined cataract, Left Eye (H25.812)  Post-operative diagnosis: Visually significant age-related combined cataract, Left Eye (H25.812)  Procedure:  1. Removal of cataract via phacoemulsification and insertion of intra-ocular lens [place]  +21.5D into the capsular bag of the Left Eye 2. Placement of Dextenza Implant. Left Lower Eyelid  Attending surgeon: Gerda Diss. Topaz Raglin, MD, MA  Anesthesia: MAC, Topical [place]  Complications: None  Estimated Blood Loss: <83m (minimal)  Specimens: None  Implants: As above  Indications:  Visually significant age-related cataract, Left Eye  Procedure:  The patient was seen and identified in the pre-operative area. The operative eye was identified and dilated.  The operative eye was marked.  Topical anesthesia was administered to the operative eye.     The patient was then to the operative suite and placed in the supine position.  A timeout was performed confirming the patient, procedure to be performed, and all other relevant information.   The patient's face was prepped and draped in the usual fashion for intra-ocular surgery.  A lid speculum was placed into the operative eye and the surgical microscope moved into place and focused.  An inferotemporal paracentesis was created using a 20 gauge paracentesis blade.  Shugarcaine was injected into the anterior chamber.  Viscoelastic was injected into the anterior chamber.  A temporal clear-corneal main wound incision was created using a 2.455mmicrokeratome.  A continuous curvilinear capsulorrhexis was initiated using an irrigating cystitome and completed using capsulorrhexis forceps.  Hydrodissection and hydrodeliniation were performed.  Viscoelastic was injected into the anterior chamber.  A phacoemulsification handpiece and a chopper as a second instrument were used to remove the nucleus and  epinucleus. The irrigation/aspiration handpiece was used to remove any remaining cortical material.   The capsular bag was reinflated with viscoelastic, checked, and found to be intact.  The intraocular lens was inserted into the capsular bag.  The irrigation/aspiration handpiece was used to remove any remaining viscoelastic.  The clear corneal wound and paracentesis wounds were then hydrated and checked with Weck-Cels to be watertight.  The lid-speculum was removed.  A Dextenza implant was placed in the lower canaliculus. The drape was removed.  The patient's face was cleaned with a wet and dry 4x4.   Maxitrol was instilled in the eye before a clear shield was taped over the eye. The patient was taken to the post-operative care unit in good condition, having tolerated the procedure well.  Post-Op Instructions: [facility] and will receive all other orders and instructions.

## 2019-12-01 DIAGNOSIS — I2510 Atherosclerotic heart disease of native coronary artery without angina pectoris: Secondary | ICD-10-CM | POA: Diagnosis not present

## 2019-12-01 DIAGNOSIS — I10 Essential (primary) hypertension: Secondary | ICD-10-CM | POA: Diagnosis not present

## 2019-12-01 DIAGNOSIS — Z6841 Body Mass Index (BMI) 40.0 and over, adult: Secondary | ICD-10-CM | POA: Diagnosis not present

## 2019-12-01 DIAGNOSIS — Z87891 Personal history of nicotine dependence: Secondary | ICD-10-CM | POA: Diagnosis not present

## 2019-12-01 DIAGNOSIS — N400 Enlarged prostate without lower urinary tract symptoms: Secondary | ICD-10-CM | POA: Diagnosis not present

## 2019-12-01 DIAGNOSIS — Z299 Encounter for prophylactic measures, unspecified: Secondary | ICD-10-CM | POA: Diagnosis not present

## 2019-12-14 DIAGNOSIS — D485 Neoplasm of uncertain behavior of skin: Secondary | ICD-10-CM | POA: Diagnosis not present

## 2019-12-14 DIAGNOSIS — L570 Actinic keratosis: Secondary | ICD-10-CM | POA: Diagnosis not present

## 2020-03-31 DIAGNOSIS — Z23 Encounter for immunization: Secondary | ICD-10-CM | POA: Diagnosis not present

## 2020-04-18 DIAGNOSIS — W268XXA Contact with other sharp object(s), not elsewhere classified, initial encounter: Secondary | ICD-10-CM | POA: Diagnosis not present

## 2020-04-18 DIAGNOSIS — K219 Gastro-esophageal reflux disease without esophagitis: Secondary | ICD-10-CM | POA: Diagnosis not present

## 2020-04-18 DIAGNOSIS — I252 Old myocardial infarction: Secondary | ICD-10-CM | POA: Diagnosis not present

## 2020-04-18 DIAGNOSIS — S6992XA Unspecified injury of left wrist, hand and finger(s), initial encounter: Secondary | ICD-10-CM | POA: Diagnosis not present

## 2020-04-18 DIAGNOSIS — S61412A Laceration without foreign body of left hand, initial encounter: Secondary | ICD-10-CM | POA: Diagnosis not present

## 2020-04-18 DIAGNOSIS — I10 Essential (primary) hypertension: Secondary | ICD-10-CM | POA: Diagnosis not present

## 2020-04-18 DIAGNOSIS — E785 Hyperlipidemia, unspecified: Secondary | ICD-10-CM | POA: Diagnosis not present

## 2020-04-18 DIAGNOSIS — Z23 Encounter for immunization: Secondary | ICD-10-CM | POA: Diagnosis not present

## 2020-05-04 DIAGNOSIS — Z299 Encounter for prophylactic measures, unspecified: Secondary | ICD-10-CM | POA: Diagnosis not present

## 2020-05-04 DIAGNOSIS — I10 Essential (primary) hypertension: Secondary | ICD-10-CM | POA: Diagnosis not present

## 2020-05-04 DIAGNOSIS — I2510 Atherosclerotic heart disease of native coronary artery without angina pectoris: Secondary | ICD-10-CM | POA: Diagnosis not present

## 2020-05-04 DIAGNOSIS — Z6841 Body Mass Index (BMI) 40.0 and over, adult: Secondary | ICD-10-CM | POA: Diagnosis not present

## 2020-05-29 DIAGNOSIS — H11153 Pinguecula, bilateral: Secondary | ICD-10-CM | POA: Diagnosis not present

## 2020-05-29 DIAGNOSIS — H18413 Arcus senilis, bilateral: Secondary | ICD-10-CM | POA: Diagnosis not present

## 2020-05-29 DIAGNOSIS — H02834 Dermatochalasis of left upper eyelid: Secondary | ICD-10-CM | POA: Diagnosis not present

## 2020-05-29 DIAGNOSIS — H02831 Dermatochalasis of right upper eyelid: Secondary | ICD-10-CM | POA: Diagnosis not present

## 2020-05-31 DIAGNOSIS — Z0000 Encounter for general adult medical examination without abnormal findings: Secondary | ICD-10-CM | POA: Diagnosis not present

## 2020-05-31 DIAGNOSIS — Z125 Encounter for screening for malignant neoplasm of prostate: Secondary | ICD-10-CM | POA: Diagnosis not present

## 2020-05-31 DIAGNOSIS — Z79899 Other long term (current) drug therapy: Secondary | ICD-10-CM | POA: Diagnosis not present

## 2020-05-31 DIAGNOSIS — Z299 Encounter for prophylactic measures, unspecified: Secondary | ICD-10-CM | POA: Diagnosis not present

## 2020-05-31 DIAGNOSIS — I10 Essential (primary) hypertension: Secondary | ICD-10-CM | POA: Diagnosis not present

## 2020-05-31 DIAGNOSIS — R5383 Other fatigue: Secondary | ICD-10-CM | POA: Diagnosis not present

## 2020-05-31 DIAGNOSIS — Z7189 Other specified counseling: Secondary | ICD-10-CM | POA: Diagnosis not present

## 2020-05-31 DIAGNOSIS — Z6841 Body Mass Index (BMI) 40.0 and over, adult: Secondary | ICD-10-CM | POA: Diagnosis not present

## 2020-05-31 DIAGNOSIS — Z1339 Encounter for screening examination for other mental health and behavioral disorders: Secondary | ICD-10-CM | POA: Diagnosis not present

## 2020-05-31 DIAGNOSIS — Z1331 Encounter for screening for depression: Secondary | ICD-10-CM | POA: Diagnosis not present

## 2020-05-31 DIAGNOSIS — E7800 Pure hypercholesterolemia, unspecified: Secondary | ICD-10-CM | POA: Diagnosis not present

## 2020-06-14 DIAGNOSIS — L570 Actinic keratosis: Secondary | ICD-10-CM | POA: Diagnosis not present

## 2020-06-14 DIAGNOSIS — C4441 Basal cell carcinoma of skin of scalp and neck: Secondary | ICD-10-CM | POA: Diagnosis not present

## 2020-07-14 ENCOUNTER — Ambulatory Visit (INDEPENDENT_AMBULATORY_CARE_PROVIDER_SITE_OTHER): Payer: Medicare Other | Admitting: Cardiology

## 2020-07-14 ENCOUNTER — Telehealth: Payer: Self-pay | Admitting: Cardiology

## 2020-07-14 ENCOUNTER — Encounter: Payer: Self-pay | Admitting: Cardiology

## 2020-07-14 VITALS — BP 122/78 | HR 84 | Ht 66.0 in | Wt 255.0 lb

## 2020-07-14 DIAGNOSIS — I350 Nonrheumatic aortic (valve) stenosis: Secondary | ICD-10-CM | POA: Diagnosis not present

## 2020-07-14 DIAGNOSIS — I25119 Atherosclerotic heart disease of native coronary artery with unspecified angina pectoris: Secondary | ICD-10-CM | POA: Diagnosis not present

## 2020-07-14 DIAGNOSIS — E782 Mixed hyperlipidemia: Secondary | ICD-10-CM | POA: Diagnosis not present

## 2020-07-14 NOTE — Progress Notes (Signed)
Cardiology Office Note  Date: 07/14/2020   ID: Melvin Harris, DOB [birth year], MRN [id number]  PCP:  Monico Blitz, MD  Cardiologist:  Rozann Lesches, MD Electrophysiologist:  None   Chief Complaint  Patient presents with  . Cardiac follow-up    History of Present Illness: Melvin Harris is a 75 y.o. male last seen in October 2020.  He presents for a routine visit.  Since last assessment, he does not describe any significant angina or nitroglycerin use.  I reviewed his recent lab work as noted below.  We also went over his medications.  He has a history of statin intolerance, has been able to take Crestor 5 mg once a week so far.  I personally reviewed his ECG today which shows sinus rhythm with left anterior fascicular block.  Echocardiogram done at [facility] back in 2018 reported LVEF greater than 55% with grade 1 diastolic dysfunction, mitral calcification, and aortic stenosis (degree unclear).  Past Medical History:  Diagnosis Date  . BPH (benign prostatic hyperplasia)   . CAD (coronary artery disease)    Cardiac catheterization November 2018  - chronically occluded mid circumflex with right-to-left collaterals, otherwise mild to moderate coronary atherosclerosis  . Chronic back pain   . Essential hypertension   . GERD (gastroesophageal reflux disease)   . NSTEMI (non-ST elevated myocardial infarction) [facility])    November 2018 - medically managed  . Varicose vein of leg     Past Surgical History:  Procedure Laterality Date  . CATARACT EXTRACTION W/PHACO Right 10/11/2019   Procedure: CATARACT EXTRACTION PHACO AND INTRAOCULAR LENS PLACEMENT ([facility]);  Surgeon: Baruch Goldmann, MD;  Location: [facility];  Service: Ophthalmology;  Laterality: Right;  CDE: 26.77  . CATARACT EXTRACTION W/PHACO Left 11/08/2019   Procedure: CATARACT EXTRACTION PHACO AND INTRAOCULAR LENS PLACEMENT ([facility]) CDE: 20.10;  Surgeon: Baruch Goldmann, MD;  Location: [facility];  Service: Ophthalmology;  Laterality:  Left;  . CHOLECYSTECTOMY    . SPINAL FUSION      Current Outpatient Medications  Medication Sig Dispense Refill  . aspirin EC 81 MG tablet Take 81 mg by mouth daily.    Marland Kitchen lisinopril (PRINIVIL,ZESTRIL) 20 MG tablet Take 20 mg by mouth daily.    . metoprolol tartrate (LOPRESSOR) 25 MG tablet Take 12.5 mg by mouth 2 (two) times daily.     . nitroGLYCERIN (NITROSTAT) 0.4 MG SL tablet PLACE 1 TABLET UNDER THE TONGUE AS NEEDED EVERY 5 MINUTES FOR CHEST PAIN-MAX 3 DOSES IN 15 MINUTES,IF NO RELIEF CALL 911 OR PRECRIBER (Patient taking differently: Place 0.4 mg under the tongue every 5 (five) minutes x 3 doses as needed for chest pain.) 25 tablet 3  . omeprazole (PRILOSEC) 20 MG capsule Take 20 mg by mouth in the morning and at bedtime.     . rosuvastatin (CRESTOR) 5 MG tablet Take 5 mg by mouth every Sunday.     . trolamine salicylate (ASPERCREME) 10 % cream Apply 1 application topically 3 (three) times daily as needed for muscle pain (muscle/back pain.).      No current facility-administered medications for this visit.   Allergies:  Plavix [clopidogrel]   ROS: No palpitations or syncope.  Physical Exam: VS:  BP 122/78   Pulse 84   Ht 5\' 6"  (1.676 m)   Wt 255 lb (115.7 kg)   SpO2 98%   BMI 41.16 kg/m , BMI Body mass index is 41.16 kg/m.  Wt Readings from Last 3 Encounters:  07/14/20 255 lb (  115.7 kg)  10/08/19 260 lb (117.9 kg)  04/26/19 254 lb (115.2 kg)    General: Patient appears comfortable at rest. HEENT: Conjunctiva and lids normal, wearing a mask. Neck: Supple, no elevated JVP or carotid bruits, no thyromegaly. Lungs: Clear to auscultation, nonlabored breathing at rest. Cardiac: Regular rate and rhythm, no S3, 3/6 systolic murmur. Abdomen: Protuberant, nontender, bowel sounds present, . Extremities: Trace ankle edema, distal pulses 2+. Skin: Warm and dry. Musculoskeletal: No kyphosis. Neuropsychiatric: Alert and oriented x3, affect grossly appropriate.  ECG:  An ECG  dated 09/01/2018 was personally reviewed today and demonstrated:  Sinus rhythm with low voltage in the precordial leads, poor R wave progression, nonspecific T wave changes.  Recent Labwork:  December 2021: Hemoglobin 14.8, platelets 212, BUN 14, creatinine 0.8 potassium 5.0, AST 18, ALT 17, cholesterol 185, triglycerides 129, HDL 40, LDL 122, TSH 2.55  Other Studies Reviewed Today:  Cardiac catheterization 05/24/2017([facility]): FINDINGS Hemodynamics and Left Heart Catheterization  Aortic pressure: 138/80 mm Hg (mean 104 mm Hg)  Left ventricular filling pressure: elevated (LVEDP = 20 mm Hg). Left Ventriculogram  RAO Left Ventriculogram:Normal  Ejection Fraction (visual estimate): 55%  Mitral Regurgitation: None  Wall motion: Normal Coronary Angiography Dominance: Right Left Main: The left main coronary artery (LMCA) is a large-caliber vessel that originates from the left coronary sinus. It trifurcates into the left anterior descending (LAD), ramus intermedius (RI), and left circumflex (LCx) arteries. There is no angiographic evidence of significant disease in the LMCA. LAD: The LAD is a large-caliber vessel that gives off 2 diagonal (D) branches before it wraps around the apex. D1 is a small-caliber vessel. D2 is a very small-caliber vessel. There is moderate diffuse disease up to 40% in the LAD. Ramus Intermedius: The RI is a large-caliber vessel with moderate diffuse up to 40%. Left Circumflex: The LCx is a large-caliber vessel that gives off 2 obtuse marginal (OM) branches and then continues as a small vessel in the AV groove. OM1 is a very small-caliber vessel. OM2 is a small-caliber vessel. There is a mid LCx 100% chronic total occlusion. The distal LCx and OM2 fill via right-to-left collaterals. Right Coronary: The right coronary artery (RCA) is a large-caliber vessel originating from the right coronary sinus. It bifurcates distally into the posterior descending artery (PDA) and a  posterolateral (PL) branch consistent with a right dominant system. There is mild to moderate diffuse disease up to 30% in the RCA.  Assessment and Plan:  1. CAD with known occlusion of the mid circumflex associated with right to left collaterals and otherwise mild to moderate disease that is managed medically.  He does not report any active angina at this time. ECG reviewed and stable.  Continue aspirin, Lopressor, lisinopril, Crestor, and as needed nitroglycerin.  2. Aortic stenosis, degree uncertain based on outside echocardiogram from 2018.  Follow-up echocardiogram will be obtained.  3. Mixed hyperlipidemia with statin intolerance.  He is tolerating Crestor 5 mg daily so far, may be able to gradually uptitrate over time.  Continue to work on weight loss and diet.  Medication Adjustments/Labs and Tests Ordered: Current medicines are reviewed at length with the patient today.  Concerns regarding medicines are outlined above.   Tests Ordered: Orders Placed This Encounter  Procedures  . EKG 12-Lead  . ECHOCARDIOGRAM COMPLETE    Medication Changes: No orders of the defined types were placed in this encounter.   Disposition:  Follow up 1 year in the [place] office.  Signed, Mikeal Hawthorne  Delman Kitten, MD, [facility] 07/14/2020 2:17 PM    [facility] at [place], [place], [place] [address] Phone: [phone]; Fax: [phone]

## 2020-07-14 NOTE — Patient Instructions (Signed)
Medication Instructions:  Continue all current medications.  Labwork: none  Testing/Procedures:  Your physician has requested that you have an echocardiogram with definity.  Echocardiography is a painless test that uses sound waves to create images of your heart. It provides your doctor with information about the size and shape of your heart and how well your heart's chambers and valves are working. This procedure takes approximately one hour. There are no restrictions for this procedure.  Office will contact with results via phone or letter.    Follow-Up: Your physician wants you to follow up in:  1 year.  You will receive a reminder letter in the mail one-two months in advance.  If you don't receive a letter, please call our office to schedule the follow up appointment.    Any Other Special Instructions Will Be Listed Below (If Applicable).  If you need a refill on your cardiac medications before your next appointment, please call your pharmacy.

## 2020-07-14 NOTE — Telephone Encounter (Signed)
Pre-cert Verification for the following procedure    echo w/ definity - aortic stenosis - [place]  DATE:   07/19/2020  LOCATION: [facility]

## 2020-07-19 ENCOUNTER — Ambulatory Visit (HOSPITAL_COMMUNITY)
Admission: RE | Admit: 2020-07-19 | Discharge: 2020-07-19 | Disposition: A | Discharge status: Home / Self Care | Payer: Medicare Other | Source: Ambulatory Visit | Attending: Cardiology | Admitting: Cardiology

## 2020-07-19 ENCOUNTER — Other Ambulatory Visit: Payer: Self-pay

## 2020-07-19 DIAGNOSIS — I350 Nonrheumatic aortic (valve) stenosis: Secondary | ICD-10-CM | POA: Diagnosis not present

## 2020-07-19 LAB — ECHOCARDIOGRAM COMPLETE
AR max vel: 1.67 cm2
AV Area VTI: 1.82 cm2
AV Area mean vel: 1.87 cm2
AV Mean grad: 10.5 mmHg
AV Peak grad: 21.8 mmHg
Ao pk vel: 2.34 m/s
Area-P 1/2: 3.11 cm2
S' Lateral: 2.1 cm

## 2020-07-19 MED ORDER — PERFLUTREN LIPID MICROSPHERE
1.0000 mL | INTRAVENOUS | Status: AC | PRN
  Administered 2020-07-19: 3 mL via INTRAVENOUS
  Filled 2020-07-19: qty 10

## 2020-07-19 NOTE — Progress Notes (Signed)
*  PRELIMINARY RESULTS* Echocardiogram 2D Echocardiogram has been performed with Definity.  Samuel Germany 07/19/2020, 4:27 PM

## 2020-07-20 ENCOUNTER — Telehealth: Payer: Self-pay | Admitting: *Deleted

## 2020-07-20 NOTE — Telephone Encounter (Signed)
Patient's wife informed. Copy sent to PCP 

## 2020-07-20 NOTE — Telephone Encounter (Signed)
-----   Message from Satira Sark, MD sent at 07/19/2020  4:50 PM EST ----- Results reviewed.  LVEF vigorous at 65 to 70%.  Aortic stenosis is mild by current parameters.  Continue with current medications and follow-up plan.

## 2020-12-11 DIAGNOSIS — L570 Actinic keratosis: Secondary | ICD-10-CM | POA: Diagnosis not present

## 2021-03-29 DIAGNOSIS — Z23 Encounter for immunization: Secondary | ICD-10-CM | POA: Diagnosis not present

## 2021-04-10 DIAGNOSIS — Z20828 Contact with and (suspected) exposure to other viral communicable diseases: Secondary | ICD-10-CM | POA: Diagnosis not present

## 2021-06-01 DIAGNOSIS — Z79899 Other long term (current) drug therapy: Secondary | ICD-10-CM | POA: Diagnosis not present

## 2021-06-01 DIAGNOSIS — Z1339 Encounter for screening examination for other mental health and behavioral disorders: Secondary | ICD-10-CM | POA: Diagnosis not present

## 2021-06-01 DIAGNOSIS — R5383 Other fatigue: Secondary | ICD-10-CM | POA: Diagnosis not present

## 2021-06-01 DIAGNOSIS — Z0000 Encounter for general adult medical examination without abnormal findings: Secondary | ICD-10-CM | POA: Diagnosis not present

## 2021-06-01 DIAGNOSIS — I10 Essential (primary) hypertension: Secondary | ICD-10-CM | POA: Diagnosis not present

## 2021-06-01 DIAGNOSIS — Z7189 Other specified counseling: Secondary | ICD-10-CM | POA: Diagnosis not present

## 2021-06-01 DIAGNOSIS — Z125 Encounter for screening for malignant neoplasm of prostate: Secondary | ICD-10-CM | POA: Diagnosis not present

## 2021-06-01 DIAGNOSIS — Z1331 Encounter for screening for depression: Secondary | ICD-10-CM | POA: Diagnosis not present

## 2021-06-01 DIAGNOSIS — Z6841 Body Mass Index (BMI) 40.0 and over, adult: Secondary | ICD-10-CM | POA: Diagnosis not present

## 2021-06-01 DIAGNOSIS — Z299 Encounter for prophylactic measures, unspecified: Secondary | ICD-10-CM | POA: Diagnosis not present

## 2021-06-01 DIAGNOSIS — E7800 Pure hypercholesterolemia, unspecified: Secondary | ICD-10-CM | POA: Diagnosis not present

## 2021-06-05 DIAGNOSIS — L570 Actinic keratosis: Secondary | ICD-10-CM | POA: Diagnosis not present

## 2021-08-06 DIAGNOSIS — I10 Essential (primary) hypertension: Secondary | ICD-10-CM | POA: Diagnosis not present

## 2021-08-06 DIAGNOSIS — Z299 Encounter for prophylactic measures, unspecified: Secondary | ICD-10-CM | POA: Diagnosis not present

## 2021-08-06 DIAGNOSIS — J069 Acute upper respiratory infection, unspecified: Secondary | ICD-10-CM | POA: Diagnosis not present

## 2021-08-06 DIAGNOSIS — Z87891 Personal history of nicotine dependence: Secondary | ICD-10-CM | POA: Diagnosis not present

## 2021-08-31 DIAGNOSIS — Z20822 Contact with and (suspected) exposure to covid-19: Secondary | ICD-10-CM | POA: Diagnosis not present

## 2021-09-14 DIAGNOSIS — Z20828 Contact with and (suspected) exposure to other viral communicable diseases: Secondary | ICD-10-CM | POA: Diagnosis not present

## 2021-09-19 DIAGNOSIS — Z20822 Contact with and (suspected) exposure to covid-19: Secondary | ICD-10-CM | POA: Diagnosis not present

## 2021-09-29 DIAGNOSIS — Z20828 Contact with and (suspected) exposure to other viral communicable diseases: Secondary | ICD-10-CM | POA: Diagnosis not present

## 2021-10-03 ENCOUNTER — Encounter: Payer: Self-pay | Admitting: Cardiology

## 2021-10-03 ENCOUNTER — Encounter: Payer: Self-pay | Admitting: *Deleted

## 2021-10-03 ENCOUNTER — Ambulatory Visit (INDEPENDENT_AMBULATORY_CARE_PROVIDER_SITE_OTHER): Payer: Medicare Other | Admitting: Cardiology

## 2021-10-03 VITALS — BP 148/90 | HR 76 | Ht 66.0 in | Wt 255.0 lb

## 2021-10-03 DIAGNOSIS — I25119 Atherosclerotic heart disease of native coronary artery with unspecified angina pectoris: Secondary | ICD-10-CM

## 2021-10-03 DIAGNOSIS — I350 Nonrheumatic aortic (valve) stenosis: Secondary | ICD-10-CM

## 2021-10-03 NOTE — Patient Instructions (Addendum)

## 2021-10-03 NOTE — Progress Notes (Signed)
? ? ?Cardiology Office Note ? ?Date: 10/03/2021  ? ?ID: Melvin Harris, DOB 03/01/1946, MRN [id number] ? ?PCP:  Monico Blitz, MD  ?Cardiologist:  Rozann Lesches, MD ?Electrophysiologist:  None  ? ?Chief Complaint  ?Patient presents with  ? Cardiac follow-up  ? ? ?History of Present Illness: ?Melvin Harris is a 76 y.o. male last seen in January 2022.  He is here with his wife today for a follow-up visit.  He does not report any active angina at this time, no nitroglycerin use.  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 ? ?Follow-up echocardiogram in January 2022 revealed LVEF 65 to 70% with mild aortic stenosis, mean gradient approximately 11 mmHg. ? ?I personally reviewed his ECG today which shows sinus rhythm with left anterior fascicular block, decreased R wave progression. ? ?I went over his medications which are noted below.  We are requesting interval lab work from Dr. Manuella Ghazi. ? ?Past Medical History:  ?Diagnosis Date  ? BPH (benign prostatic hyperplasia)   ? CAD (coronary artery disease)   ? Cardiac catheterization November 2018  - chronically occluded mid circumflex with right-to-left collaterals, otherwise mild to moderate coronary atherosclerosis  ? Chronic back pain   ? Essential hypertension   ? GERD (gastroesophageal reflux disease)   ? NSTEMI (non-ST elevated myocardial infarction) ([place])   ? November 2018 - medically managed  ? Varicose vein of leg   ? ? ?Past Surgical History:  ?Procedure Laterality Date  ? CATARACT EXTRACTION W/PHACO Right 10/11/2019  ? Procedure: CATARACT EXTRACTION PHACO AND INTRAOCULAR LENS PLACEMENT ([facility]);  Surgeon: Baruch Goldmann, MD;  Location: [facility];  Service: Ophthalmology;  Laterality: Right;  CDE: 26.77  ? CATARACT EXTRACTION W/PHACO Left 11/08/2019  ? Procedure: CATARACT EXTRACTION PHACO AND INTRAOCULAR LENS PLACEMENT ([facility]) CDE: 20.10;  Surgeon: Baruch Goldmann, MD;  Location: [facility];  Service: Ophthalmology;  Laterality: Left;  ? CHOLECYSTECTOMY    ? SPINAL FUSION     ? ? ?Current Outpatient Medications  ?Medication Sig Dispense Refill  ? aspirin EC 81 MG tablet Take 81 mg by mouth daily.    ? lisinopril (PRINIVIL,ZESTRIL) 20 MG tablet Take 20 mg by mouth daily.    ? metoprolol tartrate (LOPRESSOR) 25 MG tablet Take 12.5 mg by mouth 2 (two) times daily.     ? nitroGLYCERIN (NITROSTAT) 0.4 MG SL tablet PLACE 1 TABLET UNDER THE TONGUE AS NEEDED EVERY 5 MINUTES FOR CHEST PAIN-MAX 3 DOSES IN 15 MINUTES,IF NO RELIEF CALL 911 OR PRECRIBER (Patient taking differently: Place 0.4 mg under the tongue every 5 (five) minutes x 3 doses as needed for chest pain.) 25 tablet 3  ? omeprazole (PRILOSEC) 20 MG capsule Take 20 mg by mouth in the morning and at bedtime.     ? rosuvastatin (CRESTOR) 5 MG tablet Take 5 mg by mouth every Sunday.     ? trolamine salicylate (ASPERCREME) 10 % cream Apply 1 application topically 3 (three) times daily as needed for muscle pain (muscle/back pain.).     ? ?No current facility-administered medications for this visit.  ? ?Allergies:  Plavix [clopidogrel]  ? ?ROS: No syncope. ? ?Physical Exam: ?VS:  BP (!) 148/90   Pulse 76   Ht '5\' 6"'$  (1.676 m)   Wt 255 lb (115.7 kg)   SpO2 96%   BMI 41.16 kg/m? , BMI Body mass index is 41.16 kg/m?. ? ?Wt Readings from Last 3 Encounters:  ?10/03/21 255 lb (115.7 kg)  ?07/14/20 255 lb (115.7  kg)  ?10/08/19 260 lb (117.9 kg)  ?  ?General: Patient appears comfortable at rest. ?HEENT: Conjunctiva and lids normal. ?Neck: Supple, no elevated JVP or carotid bruits, no thyromegaly. ?Lungs: Clear to auscultation, nonlabored breathing at rest. ?Cardiac: Regular rate and rhythm, no S3, 2/6 systolic murmur, no pericardial rub. ?Extremities: No pitting edema. ? ?ECG:  An ECG dated 07/14/2020 was personally reviewed today and demonstrated:  Sinus rhythm left anterior fascicular block. ? ?Recent Labwork: ? ?December 2021: Hemoglobin 14.8, platelets 212, BUN 14, creatinine 0.8, potassium 5.0, AST 18, ALT 17, cholesterol 185, triglycerides  129, HDL 40, LDL 122, TSH 2.55 ? ?Other Studies Reviewed Today: ? ?Echocardiogram 07/19/2020: ? 1. Left ventricular ejection fraction, by estimation, is 65 to 70%. The  ?left ventricle has normal function. The left ventricle has no regional  ?wall motion abnormalities. There is mild left ventricular hypertrophy.  ?Left ventricular diastolic parameters  ?are indeterminate.  ? 2. Right ventricular systolic function is normal. The right ventricular  ?size is normal. Tricuspid regurgitation signal is inadequate for assessing  ?PA pressure.  ? 3. The mitral valve is grossly normal. Trivial mitral valve  ?regurgitation.  ? 4. The aortic valve is tricuspid. There is moderate calcification of the  ?aortic valve. Aortic valve regurgitation is not visualized. Mild aortic  ?valve stenosis. Aortic valve area, by VTI measures 1.82 cm?Marland Kitchen Aortic valve  ?mean gradient measures 10.5 mmHg.  ?Dimentionless index 0.58.  ? 5. The inferior vena cava is dilated in size with >50% respiratory  ?variability, suggesting right atrial pressure of 8 mmHg.  ? ?Assessment and Plan: ? ?1.  CAD with known occlusion of the mid circumflex associated with a right to left collaterals and otherwise nonobstructive disease.  He does not report any angina or nitroglycerin use.  ECG reviewed.  Would continue with medications and observation for now.  Currently on aspirin, lisinopril, Lopressor, Crestor, and as needed nitroglycerin. ? ?2.  Mild calcific aortic stenosis with mean gradient 11 mmHg.  No significant change on examination.  Continue with observation for now. ? ?Medication Adjustments/Labs and Tests Ordered: ?Current medicines are reviewed at length with the patient today.  Concerns regarding medicines are outlined above.  ? ?Tests Ordered: ?Orders Placed This Encounter  ?Procedures  ? EKG 12-Lead  ? ? ?Medication Changes: ?No orders of the defined types were placed in this encounter. ? ? ?Disposition:  Follow up  1 year, sooner if  needed. ? ?Signed, ?Satira Sark, MD, [facility] ?10/03/2021 11:43 AM    ?[facility] ?[address], [place], [place] [address] ?Phone: [phone]; Fax: [phone]  ?

## 2021-10-24 DIAGNOSIS — Z20822 Contact with and (suspected) exposure to covid-19: Secondary | ICD-10-CM | POA: Diagnosis not present

## 2021-10-31 DIAGNOSIS — Z20822 Contact with and (suspected) exposure to covid-19: Secondary | ICD-10-CM | POA: Diagnosis not present

## 2021-11-01 DIAGNOSIS — Z20822 Contact with and (suspected) exposure to covid-19: Secondary | ICD-10-CM | POA: Diagnosis not present

## 2021-11-05 DIAGNOSIS — Z20822 Contact with and (suspected) exposure to covid-19: Secondary | ICD-10-CM | POA: Diagnosis not present

## 2021-12-04 DIAGNOSIS — Z1283 Encounter for screening for malignant neoplasm of skin: Secondary | ICD-10-CM | POA: Diagnosis not present

## 2021-12-04 DIAGNOSIS — L570 Actinic keratosis: Secondary | ICD-10-CM | POA: Diagnosis not present

## 2021-12-04 DIAGNOSIS — Z85828 Personal history of other malignant neoplasm of skin: Secondary | ICD-10-CM | POA: Diagnosis not present

## 2022-04-03 DIAGNOSIS — Z23 Encounter for immunization: Secondary | ICD-10-CM | POA: Diagnosis not present

## 2022-04-16 DIAGNOSIS — Z23 Encounter for immunization: Secondary | ICD-10-CM | POA: Diagnosis not present

## 2022-06-05 DIAGNOSIS — Z0000 Encounter for general adult medical examination without abnormal findings: Secondary | ICD-10-CM | POA: Diagnosis not present

## 2022-06-05 DIAGNOSIS — Z299 Encounter for prophylactic measures, unspecified: Secondary | ICD-10-CM | POA: Diagnosis not present

## 2022-06-05 DIAGNOSIS — Z1331 Encounter for screening for depression: Secondary | ICD-10-CM | POA: Diagnosis not present

## 2022-06-05 DIAGNOSIS — Z79899 Other long term (current) drug therapy: Secondary | ICD-10-CM | POA: Diagnosis not present

## 2022-06-05 DIAGNOSIS — Z1339 Encounter for screening examination for other mental health and behavioral disorders: Secondary | ICD-10-CM | POA: Diagnosis not present

## 2022-06-05 DIAGNOSIS — Z6841 Body Mass Index (BMI) 40.0 and over, adult: Secondary | ICD-10-CM | POA: Diagnosis not present

## 2022-06-05 DIAGNOSIS — Z1211 Encounter for screening for malignant neoplasm of colon: Secondary | ICD-10-CM | POA: Diagnosis not present

## 2022-06-05 DIAGNOSIS — R5383 Other fatigue: Secondary | ICD-10-CM | POA: Diagnosis not present

## 2022-06-05 DIAGNOSIS — E7800 Pure hypercholesterolemia, unspecified: Secondary | ICD-10-CM | POA: Diagnosis not present

## 2022-06-05 DIAGNOSIS — Z125 Encounter for screening for malignant neoplasm of prostate: Secondary | ICD-10-CM | POA: Diagnosis not present

## 2022-06-05 DIAGNOSIS — Z7189 Other specified counseling: Secondary | ICD-10-CM | POA: Diagnosis not present

## 2022-06-07 ENCOUNTER — Encounter: Payer: Self-pay | Admitting: Internal Medicine

## 2022-06-12 DIAGNOSIS — L570 Actinic keratosis: Secondary | ICD-10-CM | POA: Diagnosis not present

## 2022-06-12 DIAGNOSIS — Z85828 Personal history of other malignant neoplasm of skin: Secondary | ICD-10-CM | POA: Diagnosis not present

## 2022-06-12 DIAGNOSIS — D239 Other benign neoplasm of skin, unspecified: Secondary | ICD-10-CM | POA: Diagnosis not present

## 2022-06-12 DIAGNOSIS — Z1283 Encounter for screening for malignant neoplasm of skin: Secondary | ICD-10-CM | POA: Diagnosis not present

## 2022-06-12 DIAGNOSIS — Z85820 Personal history of malignant melanoma of skin: Secondary | ICD-10-CM | POA: Diagnosis not present

## 2022-09-23 DIAGNOSIS — H906 Mixed conductive and sensorineural hearing loss, bilateral: Secondary | ICD-10-CM | POA: Diagnosis not present

## 2022-12-04 DIAGNOSIS — L570 Actinic keratosis: Secondary | ICD-10-CM | POA: Diagnosis not present

## 2022-12-04 DIAGNOSIS — Z85820 Personal history of malignant melanoma of skin: Secondary | ICD-10-CM | POA: Diagnosis not present

## 2022-12-04 DIAGNOSIS — Z85828 Personal history of other malignant neoplasm of skin: Secondary | ICD-10-CM | POA: Diagnosis not present

## 2022-12-04 DIAGNOSIS — Z1283 Encounter for screening for malignant neoplasm of skin: Secondary | ICD-10-CM | POA: Diagnosis not present

## 2022-12-04 DIAGNOSIS — D485 Neoplasm of uncertain behavior of skin: Secondary | ICD-10-CM | POA: Diagnosis not present

## 2022-12-05 DIAGNOSIS — I10 Essential (primary) hypertension: Secondary | ICD-10-CM | POA: Diagnosis not present

## 2022-12-05 DIAGNOSIS — Z6841 Body Mass Index (BMI) 40.0 and over, adult: Secondary | ICD-10-CM | POA: Diagnosis not present

## 2022-12-05 DIAGNOSIS — Z299 Encounter for prophylactic measures, unspecified: Secondary | ICD-10-CM | POA: Diagnosis not present

## 2022-12-05 DIAGNOSIS — E7800 Pure hypercholesterolemia, unspecified: Secondary | ICD-10-CM | POA: Diagnosis not present

## 2022-12-18 DIAGNOSIS — R5383 Other fatigue: Secondary | ICD-10-CM | POA: Diagnosis not present

## 2022-12-18 DIAGNOSIS — N390 Urinary tract infection, site not specified: Secondary | ICD-10-CM | POA: Diagnosis not present

## 2022-12-18 DIAGNOSIS — Z299 Encounter for prophylactic measures, unspecified: Secondary | ICD-10-CM | POA: Diagnosis not present

## 2022-12-18 DIAGNOSIS — I10 Essential (primary) hypertension: Secondary | ICD-10-CM | POA: Diagnosis not present

## 2022-12-18 DIAGNOSIS — M545 Low back pain, unspecified: Secondary | ICD-10-CM | POA: Diagnosis not present

## 2023-01-22 ENCOUNTER — Other Ambulatory Visit: Payer: Self-pay | Admitting: *Deleted

## 2023-01-22 ENCOUNTER — Encounter: Payer: Self-pay | Admitting: Cardiology

## 2023-01-22 ENCOUNTER — Ambulatory Visit: Payer: Medicare Other | Attending: Cardiology | Admitting: Cardiology

## 2023-01-22 VITALS — BP 148/92 | HR 62 | Ht 66.0 in | Wt 258.8 lb

## 2023-01-22 DIAGNOSIS — E782 Mixed hyperlipidemia: Secondary | ICD-10-CM | POA: Diagnosis not present

## 2023-01-22 DIAGNOSIS — I10 Essential (primary) hypertension: Secondary | ICD-10-CM | POA: Diagnosis not present

## 2023-01-22 DIAGNOSIS — I25119 Atherosclerotic heart disease of native coronary artery with unspecified angina pectoris: Secondary | ICD-10-CM | POA: Insufficient documentation

## 2023-01-22 DIAGNOSIS — I350 Nonrheumatic aortic (valve) stenosis: Secondary | ICD-10-CM | POA: Diagnosis not present

## 2023-01-22 MED ORDER — NITROGLYCERIN 0.4 MG SL SUBL: 0.4000 mg | SUBLINGUAL_TABLET | SUBLINGUAL | 3 refills | Status: DC | PRN

## 2023-01-22 NOTE — Patient Instructions (Addendum)
Medication Instructions:  Your physician recommends that you continue on your current medications as directed. Please refer to the Current Medication list given to you today.  Labwork: none  Testing/Procedures: Your physician has requested that you have an echocardiogram in 6 months just before next visit. Echocardiography is a painless test that uses sound waves to create images of your heart. It provides your doctor with information about the size and shape of your heart and how well your heart's chambers and valves are working. This procedure takes approximately one hour. There are no restrictions for this procedure. Please do NOT wear cologne, perfume, aftershave, or lotions (deodorant is allowed). Please arrive 15 minutes prior to your appointment time.  Follow-Up: Your physician recommends that you schedule a follow-up appointment in: 6 months  Any Other Special Instructions Will Be Listed Below (If Applicable).  If you need a refill on your cardiac medications before your next appointment, please call your pharmacy.

## 2023-01-22 NOTE — Progress Notes (Signed)
    Cardiology Office Note  Date: 01/22/2023   ID: Melvin Harris, DOB 03-12-1946, MRN [id number]  History of Present Illness: Melvin Harris is a 76 y.o. male last seen in April 2023.  He is here today with his wife for follow-up visit.  He does not report any angina or nitroglycerin use.  They just finished moving in with her son in [place], transitioned from a home with larger property in [place].  Blood pressure was elevated today, rechecked by me at 148/92.  He reports compliance with his medications and still follows with Dr. Sherryll Burger.  I recommended that they track blood pressure at home and follow-up to see if further medication adjustments are necessary.  He is on lisinopril and Lopressor.  ECG today shows sinus arrhythmia.  His last LDL was 117 in December 2023.  He has tolerated Crestor 5 mg once weekly but has statin intolerance with higher doses.  Physical Exam: VS:  BP (!) 148/92   Pulse 62   Ht 5\' 6"  (1.676 m)   Wt 258 lb 12.8 oz (117.4 kg)   SpO2 99%   BMI 41.77 kg/m , BMI Body mass index is 41.77 kg/m.  Wt Readings from Last 3 Encounters:  01/22/23 258 lb 12.8 oz (117.4 kg)  10/03/21 255 lb (115.7 kg)  07/14/20 255 lb (115.7 kg)    General: Patient appears comfortable at rest. HEENT: Conjunctiva and lids normal. Neck: Supple, no elevated JVP or carotid bruits. Lungs: Clear to auscultation, nonlabored breathing at rest. Cardiac: Regular rate and rhythm, no S3, 2/6 systolic murmur. Extremities: No pitting edema.  ECG:  An ECG dated 10/03/2021 was personally reviewed today and demonstrated:  Sinus rhythm with left anterior fascicular block, decreased R wave progression.  Labwork:  December 2023: Hemoglobin 14.8, platelets 215, BUN 15, creatinine 0.98, potassium 4.6, AST 16, ALT 15, cholesterol 186, triglycerides 166, HDL 40, LDL 117, TSH 3.36  Other Studies Reviewed Today:  No interval cardiac testing for review today.  Assessment and Plan:  1.  CAD with  chronically occluded mid circumflex associated with right to left collaterals and otherwise mild to moderate residual disease by cardiac catheterization in 2018.  LVEF 65 to 70% by echocardiogram in January 2022.  He reports no active angina or nitroglycerin use.  Continue aspirin, Lopressor, Crestor, and as a nitroglycerin.  2.  Essential hypertension.  Blood pressure elevated today.  Will track blood pressures at home and follow-up with PCP in case further adjustments are necessary.  He is on lisinopril and Lopressor.  Could consider addition of diuretic or even Norvasc.  3.  Degenerative calcific aortic stenosis, mild by last assessment.  Mean AV gradient approximately 11 mmHg by echocardiogram in January 2022.  Recheck echocardiogram in 6 months with clinical visit.  4.  Mixed hyperlipidemia.  LDL 117 in December 2023.  Tolerating low-dose Crestor otherwise with statin intolerance at higher doses.  Disposition:  Follow up  6 months.  Signed, Jonelle Sidle, M.D., F.A.C.C. [facility] at [facility]

## 2023-04-02 DIAGNOSIS — Z23 Encounter for immunization: Secondary | ICD-10-CM | POA: Diagnosis not present

## 2023-04-24 DIAGNOSIS — Z23 Encounter for immunization: Secondary | ICD-10-CM | POA: Diagnosis not present

## 2023-04-25 DIAGNOSIS — Z23 Encounter for immunization: Secondary | ICD-10-CM | POA: Diagnosis not present

## 2023-06-04 DIAGNOSIS — L570 Actinic keratosis: Secondary | ICD-10-CM | POA: Diagnosis not present

## 2023-06-09 DIAGNOSIS — Z79899 Other long term (current) drug therapy: Secondary | ICD-10-CM | POA: Diagnosis not present

## 2023-06-09 DIAGNOSIS — Z1331 Encounter for screening for depression: Secondary | ICD-10-CM | POA: Diagnosis not present

## 2023-06-09 DIAGNOSIS — R5383 Other fatigue: Secondary | ICD-10-CM | POA: Diagnosis not present

## 2023-06-09 DIAGNOSIS — Z1339 Encounter for screening examination for other mental health and behavioral disorders: Secondary | ICD-10-CM | POA: Diagnosis not present

## 2023-06-09 DIAGNOSIS — Z0000 Encounter for general adult medical examination without abnormal findings: Secondary | ICD-10-CM | POA: Diagnosis not present

## 2023-06-09 DIAGNOSIS — Z125 Encounter for screening for malignant neoplasm of prostate: Secondary | ICD-10-CM | POA: Diagnosis not present

## 2023-06-09 DIAGNOSIS — Z299 Encounter for prophylactic measures, unspecified: Secondary | ICD-10-CM | POA: Diagnosis not present

## 2023-06-09 DIAGNOSIS — Z7189 Other specified counseling: Secondary | ICD-10-CM | POA: Diagnosis not present

## 2023-06-09 DIAGNOSIS — E7800 Pure hypercholesterolemia, unspecified: Secondary | ICD-10-CM | POA: Diagnosis not present

## 2023-06-10 ENCOUNTER — Encounter: Payer: Self-pay | Admitting: Internal Medicine

## 2023-07-28 ENCOUNTER — Other Ambulatory Visit: Payer: Medicare Other

## 2023-07-30 ENCOUNTER — Ambulatory Visit: Payer: Medicare Other | Admitting: Cardiology

## 2023-09-09 DIAGNOSIS — M545 Low back pain, unspecified: Secondary | ICD-10-CM | POA: Diagnosis not present

## 2023-09-09 DIAGNOSIS — Z299 Encounter for prophylactic measures, unspecified: Secondary | ICD-10-CM | POA: Diagnosis not present

## 2023-09-09 DIAGNOSIS — I10 Essential (primary) hypertension: Secondary | ICD-10-CM | POA: Diagnosis not present

## 2023-09-09 DIAGNOSIS — Z6841 Body Mass Index (BMI) 40.0 and over, adult: Secondary | ICD-10-CM | POA: Diagnosis not present

## 2023-09-17 ENCOUNTER — Ambulatory Visit: Payer: Medicare Other | Attending: Cardiology

## 2023-09-17 DIAGNOSIS — I350 Nonrheumatic aortic (valve) stenosis: Secondary | ICD-10-CM | POA: Insufficient documentation

## 2023-09-17 LAB — ECHOCARDIOGRAM COMPLETE
AR max vel: 1.5 cm2
AV Area VTI: 1.39 cm2
AV Area mean vel: 1.46 cm2
AV Mean grad: 13 mmHg
AV Peak grad: 24 mmHg
Ao pk vel: 2.45 m/s
Area-P 1/2: 3.06 cm2
Calc EF: 66.9 %
MV VTI: 2.25 cm2
S' Lateral: 2.4 cm
Single Plane A2C EF: 66.4 %
Single Plane A4C EF: 66.5 %

## 2023-09-17 MED ORDER — PERFLUTREN LIPID MICROSPHERE
1.0000 mL | INTRAVENOUS | Status: AC | PRN
Start: 2023-09-17 — End: 2023-09-17
  Administered 2023-09-17: 5 mL via INTRAVENOUS

## 2023-09-19 ENCOUNTER — Telehealth: Payer: Self-pay | Admitting: Cardiology

## 2023-09-19 NOTE — Telephone Encounter (Signed)
 Patients wife (DPR) notified and verbalized understanding of results. Pt wife had no questions or concerns at this time.   Pt needs f/u with provider- no openings at this time in [place]. Offered [facility] or APP appointment and wife declined both.   Will send message to scheduling to schedule pt at first available appt.

## 2023-09-19 NOTE — Telephone Encounter (Signed)
 Wife Britta Mccreedy) called to follow-up on patient results.

## 2023-09-19 NOTE — Telephone Encounter (Signed)
 Jonelle Sidle, MD 09/17/2023  5:05 PM EDT     Results reviewed.  Follow-up echocardiogram shows normal LVEF at 60 to 65%.  Aortic stenosis remains mild range with mean AV gradient 13 mmHg.  Keep follow-up as scheduled.

## 2023-11-12 DIAGNOSIS — M4726 Other spondylosis with radiculopathy, lumbar region: Secondary | ICD-10-CM | POA: Diagnosis not present

## 2023-11-12 DIAGNOSIS — Z6841 Body Mass Index (BMI) 40.0 and over, adult: Secondary | ICD-10-CM | POA: Diagnosis not present

## 2023-11-12 DIAGNOSIS — Z981 Arthrodesis status: Secondary | ICD-10-CM | POA: Diagnosis not present

## 2023-12-01 DIAGNOSIS — B029 Zoster without complications: Secondary | ICD-10-CM | POA: Diagnosis not present

## 2023-12-01 DIAGNOSIS — I10 Essential (primary) hypertension: Secondary | ICD-10-CM | POA: Diagnosis not present

## 2023-12-01 DIAGNOSIS — Z299 Encounter for prophylactic measures, unspecified: Secondary | ICD-10-CM | POA: Diagnosis not present

## 2023-12-01 DIAGNOSIS — R52 Pain, unspecified: Secondary | ICD-10-CM | POA: Diagnosis not present

## 2023-12-03 DIAGNOSIS — Z299 Encounter for prophylactic measures, unspecified: Secondary | ICD-10-CM | POA: Diagnosis not present

## 2023-12-03 DIAGNOSIS — R0989 Other specified symptoms and signs involving the circulatory and respiratory systems: Secondary | ICD-10-CM | POA: Diagnosis not present

## 2023-12-03 DIAGNOSIS — I10 Essential (primary) hypertension: Secondary | ICD-10-CM | POA: Diagnosis not present

## 2023-12-09 ENCOUNTER — Other Ambulatory Visit (HOSPITAL_COMMUNITY): Payer: Self-pay | Admitting: Surgery

## 2023-12-09 DIAGNOSIS — Z981 Arthrodesis status: Secondary | ICD-10-CM

## 2023-12-12 ENCOUNTER — Ambulatory Visit (HOSPITAL_COMMUNITY)
Admission: RE | Admit: 2023-12-12 | Discharge: 2023-12-12 | Disposition: A | Discharge status: Home / Self Care | Source: Ambulatory Visit | Attending: Surgery | Admitting: Surgery

## 2023-12-12 DIAGNOSIS — Q7649 Other congenital malformations of spine, not associated with scoliosis: Secondary | ICD-10-CM | POA: Diagnosis not present

## 2023-12-12 DIAGNOSIS — Z981 Arthrodesis status: Secondary | ICD-10-CM | POA: Diagnosis not present

## 2023-12-12 DIAGNOSIS — M48061 Spinal stenosis, lumbar region without neurogenic claudication: Secondary | ICD-10-CM | POA: Diagnosis not present

## 2023-12-12 DIAGNOSIS — M4804 Spinal stenosis, thoracic region: Secondary | ICD-10-CM | POA: Diagnosis not present

## 2023-12-12 DIAGNOSIS — M5136 Other intervertebral disc degeneration, lumbar region with discogenic back pain only: Secondary | ICD-10-CM | POA: Diagnosis not present

## 2023-12-22 DIAGNOSIS — M4726 Other spondylosis with radiculopathy, lumbar region: Secondary | ICD-10-CM | POA: Diagnosis not present

## 2023-12-22 DIAGNOSIS — Z981 Arthrodesis status: Secondary | ICD-10-CM | POA: Diagnosis not present

## 2023-12-22 DIAGNOSIS — Z6841 Body Mass Index (BMI) 40.0 and over, adult: Secondary | ICD-10-CM | POA: Diagnosis not present

## 2023-12-29 DIAGNOSIS — M461 Sacroiliitis, not elsewhere classified: Secondary | ICD-10-CM | POA: Diagnosis not present

## 2024-01-07 DIAGNOSIS — I2510 Atherosclerotic heart disease of native coronary artery without angina pectoris: Secondary | ICD-10-CM | POA: Diagnosis not present

## 2024-01-07 DIAGNOSIS — G2581 Restless legs syndrome: Secondary | ICD-10-CM | POA: Diagnosis not present

## 2024-01-07 DIAGNOSIS — R52 Pain, unspecified: Secondary | ICD-10-CM | POA: Diagnosis not present

## 2024-01-07 DIAGNOSIS — I10 Essential (primary) hypertension: Secondary | ICD-10-CM | POA: Diagnosis not present

## 2024-01-07 DIAGNOSIS — Z299 Encounter for prophylactic measures, unspecified: Secondary | ICD-10-CM | POA: Diagnosis not present

## 2024-01-07 DIAGNOSIS — Z6841 Body Mass Index (BMI) 40.0 and over, adult: Secondary | ICD-10-CM | POA: Diagnosis not present

## 2024-01-21 ENCOUNTER — Ambulatory Visit: Attending: Cardiology | Admitting: Cardiology

## 2024-01-21 ENCOUNTER — Encounter: Payer: Self-pay | Admitting: Cardiology

## 2024-01-21 VITALS — BP 124/72 | HR 83 | Ht 65.0 in | Wt 266.0 lb

## 2024-01-21 DIAGNOSIS — I25119 Atherosclerotic heart disease of native coronary artery with unspecified angina pectoris: Secondary | ICD-10-CM | POA: Diagnosis not present

## 2024-01-21 DIAGNOSIS — I10 Essential (primary) hypertension: Secondary | ICD-10-CM | POA: Diagnosis not present

## 2024-01-21 DIAGNOSIS — R52 Pain, unspecified: Secondary | ICD-10-CM | POA: Diagnosis not present

## 2024-01-21 DIAGNOSIS — Z299 Encounter for prophylactic measures, unspecified: Secondary | ICD-10-CM | POA: Diagnosis not present

## 2024-01-21 DIAGNOSIS — I350 Nonrheumatic aortic (valve) stenosis: Secondary | ICD-10-CM | POA: Insufficient documentation

## 2024-01-21 DIAGNOSIS — Z6841 Body Mass Index (BMI) 40.0 and over, adult: Secondary | ICD-10-CM | POA: Diagnosis not present

## 2024-01-21 DIAGNOSIS — I2510 Atherosclerotic heart disease of native coronary artery without angina pectoris: Secondary | ICD-10-CM | POA: Diagnosis not present

## 2024-01-21 DIAGNOSIS — E782 Mixed hyperlipidemia: Secondary | ICD-10-CM | POA: Insufficient documentation

## 2024-01-21 DIAGNOSIS — M791 Myalgia, unspecified site: Secondary | ICD-10-CM | POA: Diagnosis not present

## 2024-01-21 DIAGNOSIS — T7840XA Allergy, unspecified, initial encounter: Secondary | ICD-10-CM | POA: Diagnosis not present

## 2024-01-21 MED ORDER — NITROGLYCERIN 0.4 MG SL SUBL: 0.4000 mg | SUBLINGUAL_TABLET | SUBLINGUAL | 3 refills | Status: AC | PRN

## 2024-01-21 NOTE — Patient Instructions (Addendum)

## 2024-01-21 NOTE — Progress Notes (Signed)
 Cardiology Office Note  Date: 01/21/2024   ID: Graham, Hyun 21-Dec-1945, MRN [id number]  History of Present Illness: Melvin Harris is an 78 y.o. male last seen in July 2024.  He is here today with his wife for a follow-up visit.  Reports interval episode of shingles, also a lumbar spine injection for pain control.  He does not report any angina or nitroglycerin  use.  Does describe a more pleuritic right lower costal pain wrapping around his chest, has had an intermittent cough recently, no fevers or chills.  He has not been hypoxic, his wife has checked pulse oximeter at home.  He has a visit scheduled to see his PCP today.  We went over his medications..  No change from a cardiac perspective.  Still taking Crestor 5 mg once weekly.  Blood pressure is well-controlled today.  I did review his interval lab work and we also discussed his echocardiogram from March. . I reviewed his ECG today which shows sinus arrhythmia with left anterior fascicular block.  Physical Exam: VS:  BP 124/72 (BP Location: Left Arm)   Pulse 83   Ht 5' 5 (1.651 m)   Wt 266 lb (120.7 kg)   SpO2 96%   BMI 44.26 kg/m , BMI Body mass index is 44.26 kg/m.  Wt Readings from Last 3 Encounters:  01/21/24 266 lb (120.7 kg)  01/22/23 258 lb 12.8 oz (117.4 kg)  10/03/21 255 lb (115.7 kg)    General: Patient appears comfortable at rest. HEENT: Conjunctiva and lids normal. Neck: Supple, no elevated JVP or carotid bruits. Lungs: Clear to auscultation, nonlabored breathing at rest.  No pleural rub or egophony evident at the bases. Cardiac: Regular rate and rhythm, no S3, 2/6 systolic murmur. Abdomen: Soft, bowel sounds present. Extremities: No pitting edema.  ECG:  An ECG dated 01/22/2023 was personally reviewed today and demonstrated:  Sinus arrhythmia.  Labwork:  December 2024: Cholesterol 207, triglycerides 139, HDL 41, LDL 141, TSH 3.21 January 2024: Hemoglobin 15, platelets 229, BUN 25, creatinine 1.04, GFR  73, potassium 4.6, AST 18, ALT 18, magnesium 2.3  Other Studies Reviewed Today:  Echocardiogram 09/17/2023:  1. Left ventricular ejection fraction, by estimation, is 60 to 65%. The  left ventricle has normal function. The left ventricle has no regional  wall motion abnormalities. There is mild left ventricular hypertrophy.  Left ventricular diastolic parameters  are consistent with Grade I diastolic dysfunction (impaired relaxation).   2. Right ventricular systolic function is normal. The right ventricular  size is normal. Tricuspid regurgitation signal is inadequate for assessing  PA pressure.   3. The mitral valve is abnormal. No evidence of mitral valve  regurgitation. No evidence of mitral stenosis. Moderate mitral annular  calcification.   4. The aortic valve is tricuspid. There is moderate calcification of the  aortic valve. Aortic valve regurgitation is not visualized. Mild aortic  valve stenosis. Aortic valve mean gradient measures 13.0 mmHg. Aortic  valve Vmax measures 2.45 m/s.   5. Aortic dilatation noted. There is mild dilatation of the ascending  aorta, measuring 40 mm.   Assessment and Plan:  1.  CAD with chronically occluded mid circumflex associated with right to left collaterals and otherwise mild to moderate residual disease by cardiac catheterization in 2018.  LVEF 60 to 65% by echocardiogram in March.  He does not report any angina or interval nitroglycerin  use.  ECG reviewed and stable today.  Continue aspirin 81 mg daily, Lopressor 12.5 mg twice  daily, Crestor 5 mg weekly, and has a nitroglycerin  which will be refilled.  2.  Recent pleuritic chest discomfort as described.  Possibly viral URI or pleurisy based on description, no obvious egophony or pleural rub noted.  Did offer chest x-ray, he states that he will see his PCP today for further discussion.  Not hypoxic or tachycardic.   3.  Primary hypertension.  Blood pressure well-controlled today.  Continue  lisinopril 20 mg daily.   4.  Degenerative calcific aortic stenosis, mild with mean AV gradient 13 mmHg by follow-up echocardiogram in March.  He is asymptomatic.   5.  Mixed hyperlipidemia.  LDL up to 141 in December 7975.  Has history of statin intolerance, currently on Crestor 5 mg once weekly.  Disposition:  Follow up 6 months.  Signed, Jayson JUDITHANN Sierras, M.D., F.A.C.C. [facility] at [facility]

## 2024-01-22 DIAGNOSIS — R0602 Shortness of breath: Secondary | ICD-10-CM | POA: Diagnosis not present

## 2024-01-22 DIAGNOSIS — Z299 Encounter for prophylactic measures, unspecified: Secondary | ICD-10-CM | POA: Diagnosis not present

## 2024-01-22 DIAGNOSIS — G2581 Restless legs syndrome: Secondary | ICD-10-CM | POA: Diagnosis not present

## 2024-01-22 DIAGNOSIS — R52 Pain, unspecified: Secondary | ICD-10-CM | POA: Diagnosis not present

## 2024-01-22 DIAGNOSIS — I10 Essential (primary) hypertension: Secondary | ICD-10-CM | POA: Diagnosis not present

## 2024-01-31 DIAGNOSIS — I2692 Saddle embolus of pulmonary artery without acute cor pulmonale: Secondary | ICD-10-CM | POA: Diagnosis not present

## 2024-01-31 DIAGNOSIS — I252 Old myocardial infarction: Secondary | ICD-10-CM | POA: Diagnosis not present

## 2024-01-31 DIAGNOSIS — Z9989 Dependence on other enabling machines and devices: Secondary | ICD-10-CM | POA: Diagnosis not present

## 2024-01-31 DIAGNOSIS — I82401 Acute embolism and thrombosis of unspecified deep veins of right lower extremity: Secondary | ICD-10-CM | POA: Diagnosis not present

## 2024-01-31 DIAGNOSIS — R079 Chest pain, unspecified: Secondary | ICD-10-CM | POA: Diagnosis not present

## 2024-01-31 DIAGNOSIS — R0789 Other chest pain: Secondary | ICD-10-CM | POA: Diagnosis not present

## 2024-02-01 DIAGNOSIS — Z9049 Acquired absence of other specified parts of digestive tract: Secondary | ICD-10-CM | POA: Diagnosis not present

## 2024-02-01 DIAGNOSIS — Z6841 Body Mass Index (BMI) 40.0 and over, adult: Secondary | ICD-10-CM | POA: Diagnosis not present

## 2024-02-01 DIAGNOSIS — I82401 Acute embolism and thrombosis of unspecified deep veins of right lower extremity: Secondary | ICD-10-CM | POA: Diagnosis not present

## 2024-02-01 DIAGNOSIS — I82402 Acute embolism and thrombosis of unspecified deep veins of left lower extremity: Secondary | ICD-10-CM | POA: Diagnosis not present

## 2024-02-01 DIAGNOSIS — I2692 Saddle embolus of pulmonary artery without acute cor pulmonale: Secondary | ICD-10-CM | POA: Diagnosis not present

## 2024-02-01 DIAGNOSIS — I080 Rheumatic disorders of both mitral and aortic valves: Secondary | ICD-10-CM | POA: Diagnosis not present

## 2024-02-01 DIAGNOSIS — I10 Essential (primary) hypertension: Secondary | ICD-10-CM | POA: Diagnosis not present

## 2024-02-01 DIAGNOSIS — I491 Atrial premature depolarization: Secondary | ICD-10-CM | POA: Diagnosis not present

## 2024-02-01 DIAGNOSIS — E782 Mixed hyperlipidemia: Secondary | ICD-10-CM | POA: Diagnosis not present

## 2024-02-01 DIAGNOSIS — Z7901 Long term (current) use of anticoagulants: Secondary | ICD-10-CM | POA: Diagnosis not present

## 2024-02-01 DIAGNOSIS — I82462 Acute embolism and thrombosis of left calf muscular vein: Secondary | ICD-10-CM | POA: Diagnosis not present

## 2024-02-01 DIAGNOSIS — I2510 Atherosclerotic heart disease of native coronary artery without angina pectoris: Secondary | ICD-10-CM | POA: Diagnosis not present

## 2024-02-01 DIAGNOSIS — Z7982 Long term (current) use of aspirin: Secondary | ICD-10-CM | POA: Diagnosis not present

## 2024-02-01 DIAGNOSIS — I82442 Acute embolism and thrombosis of left tibial vein: Secondary | ICD-10-CM | POA: Diagnosis not present

## 2024-02-01 DIAGNOSIS — Z85820 Personal history of malignant melanoma of skin: Secondary | ICD-10-CM | POA: Diagnosis not present

## 2024-02-01 DIAGNOSIS — Z981 Arthrodesis status: Secondary | ICD-10-CM | POA: Diagnosis not present

## 2024-02-01 DIAGNOSIS — E669 Obesity, unspecified: Secondary | ICD-10-CM | POA: Diagnosis not present

## 2024-02-01 DIAGNOSIS — I77810 Thoracic aortic ectasia: Secondary | ICD-10-CM | POA: Diagnosis not present

## 2024-02-01 DIAGNOSIS — R9431 Abnormal electrocardiogram [ECG] [EKG]: Secondary | ICD-10-CM | POA: Diagnosis not present

## 2024-02-01 DIAGNOSIS — I350 Nonrheumatic aortic (valve) stenosis: Secondary | ICD-10-CM | POA: Diagnosis not present

## 2024-02-11 DIAGNOSIS — I82409 Acute embolism and thrombosis of unspecified deep veins of unspecified lower extremity: Secondary | ICD-10-CM | POA: Diagnosis not present

## 2024-02-11 DIAGNOSIS — I2699 Other pulmonary embolism without acute cor pulmonale: Secondary | ICD-10-CM | POA: Diagnosis not present

## 2024-02-11 DIAGNOSIS — Z299 Encounter for prophylactic measures, unspecified: Secondary | ICD-10-CM | POA: Diagnosis not present

## 2024-02-11 DIAGNOSIS — I10 Essential (primary) hypertension: Secondary | ICD-10-CM | POA: Diagnosis not present

## 2024-02-27 DIAGNOSIS — Z299 Encounter for prophylactic measures, unspecified: Secondary | ICD-10-CM | POA: Diagnosis not present

## 2024-02-27 DIAGNOSIS — R52 Pain, unspecified: Secondary | ICD-10-CM | POA: Diagnosis not present

## 2024-02-27 DIAGNOSIS — R059 Cough, unspecified: Secondary | ICD-10-CM | POA: Diagnosis not present

## 2024-02-27 DIAGNOSIS — I82409 Acute embolism and thrombosis of unspecified deep veins of unspecified lower extremity: Secondary | ICD-10-CM | POA: Diagnosis not present

## 2024-02-27 DIAGNOSIS — I2699 Other pulmonary embolism without acute cor pulmonale: Secondary | ICD-10-CM | POA: Diagnosis not present

## 2024-02-27 DIAGNOSIS — I10 Essential (primary) hypertension: Secondary | ICD-10-CM | POA: Diagnosis not present

## 2024-03-15 DIAGNOSIS — Z85828 Personal history of other malignant neoplasm of skin: Secondary | ICD-10-CM | POA: Diagnosis not present

## 2024-03-15 DIAGNOSIS — L570 Actinic keratosis: Secondary | ICD-10-CM | POA: Diagnosis not present

## 2024-03-15 DIAGNOSIS — Z85820 Personal history of malignant melanoma of skin: Secondary | ICD-10-CM | POA: Diagnosis not present

## 2024-03-15 DIAGNOSIS — L821 Other seborrheic keratosis: Secondary | ICD-10-CM | POA: Diagnosis not present

## 2024-03-21 NOTE — Progress Notes (Unsigned)
 [facility]  INITIAL CONSULT NOTE  Patient Care Team: Maree Isles, MD as PCP - General (Internal Medicine) Debera Jayson MATSU, MD as PCP - Cardiology (Cardiology)  Hematological/Oncological History 02/01/2024-02/04/2024: Admitted for acute saddle pulmonary embolism after presenting with acute left-sided chest pain. CTA chest revealed large pulmonary emboli extending into the interlobular and segmental branches bilaterally. Echo found no definitive CT evidence for right heart strain, SPECT high risk however with no cardiac marker elevation, no shock, no RV dysfunction. Echocardiogram revealing EF of 60 to 65% with no right heart strain, some pulmonary hypertension. Patient was started on heparin drip which was transitioned to Eliquis therapy. 03/22/2024: Establish care with [facility]   CHIEF COMPLAINTS/PURPOSE OF CONSULTATION:  Saddle pulmonary embolism  HISTORY OF PRESENTING ILLNESS:  Melvin Harris 78 y.o. male with medical history significant for BPH, CAD with NSTEMI s/p cardiac cath, hypertension and GERD presents to the hematology clinic for evaluation for recent diagnosis of saddle pulmonary embolism. He is accompanied by his wife for this visit.   On exam today, Mr. Torbert reports that he is recovering well after recent hospitalization. His breathing is almost back to his baseline and chest pain is nearly resolved. He is tolerating Eliquis therapy without any overt signs of bleeding including hematochezia, melena, epistaxis.  He denies fevers, chills, nausea, vomiting, bowel habit changes, headaches or dizziness.  He has no other complaints.  Rest of the 10 point ROS as below. MEDICAL HISTORY:  Past Medical History:  Diagnosis Date   BPH (benign prostatic hyperplasia)    CAD (coronary artery disease)    Cardiac catheterization November 2018  - chronically occluded mid circumflex with right-to-left collaterals, otherwise mild to moderate coronary atherosclerosis   Chronic  back pain    Essential hypertension    GERD (gastroesophageal reflux disease)    NSTEMI (non-ST elevated myocardial infarction) [facility])    November 2018 - medically managed   Varicose vein of leg     SURGICAL HISTORY: Past Surgical History:  Procedure Laterality Date   CATARACT EXTRACTION W/PHACO Right 10/11/2019   Procedure: CATARACT EXTRACTION PHACO AND INTRAOCULAR LENS PLACEMENT (IOC);  Surgeon: Harrie Agent, MD;  Location: [facility];  Service: Ophthalmology;  Laterality: Right;  CDE: 26.77   CATARACT EXTRACTION W/PHACO Left 11/08/2019   Procedure: CATARACT EXTRACTION PHACO AND INTRAOCULAR LENS PLACEMENT (IOC) CDE: 20.10;  Surgeon: Harrie Agent, MD;  Location: [facility];  Service: Ophthalmology;  Laterality: Left;   CHOLECYSTECTOMY     SPINAL FUSION      SOCIAL HISTORY: Social History   Socioeconomic History   Marital status: Married    Spouse name: Not on file   Number of children: Not on file   Years of education: Not on file   Highest education level: Not on file  Occupational History   Not on file  Tobacco Use   Smoking status: Former    Current packs/day: 0.00    Types: Cigarettes    Quit date: 07/02/1967    Years since quitting: 56.7   Smokeless tobacco: Never  Vaping Use   Vaping status: Never Used  Substance and Sexual Activity   Alcohol  use: Never   Drug use: Never   Sexual activity: Not on file  Other Topics Concern   Not on file  Social History Narrative   Not on file   Social Drivers of Health   Financial Resource Strain: Low Risk  (02/02/2024)   Received from [facility]   Overall Financial Resource Strain (  CARDIA)    Difficulty of Paying Living Expenses: Not very hard  Food Insecurity: No Food Insecurity (03/22/2024)   Hunger Vital Sign    Worried About Running Out of Food in the Last Year: Never true    Ran Out of Food in the Last Year: Never true  Transportation Needs: No Transportation Needs (03/22/2024)   PRAPARE - Scientist, research (physical sciences) (Medical): No    Lack of Transportation (Non-Medical): No  Physical Activity: Insufficiently Active (02/02/2024)   Received from [facility]   Exercise Vital Sign    On average, how many days per week do you engage in moderate to strenuous exercise (like a brisk walk)?: 4 days    On average, how many minutes do you engage in exercise at this level?: 20 min  Stress: No Stress Concern Present (02/02/2024)   Received from [facility] - Occupational Stress Questionnaire    Feeling of Stress : Only a little  Social Connections: Moderately Integrated (02/02/2024)   Received from [facility]   Social Connection and Isolation Panel    In a typical week, how many times do you talk on the phone with family, friends, or neighbors?: Three times a week    How often do you get together with friends or relatives?: Twice a week    How often do you attend church or religious services?: More than 4 times per year    Do you belong to any clubs or organizations such as church groups, unions, fraternal or athletic groups, or school groups?: No    How often do you attend meetings of the clubs or organizations you belong to?: Never    Are you married, widowed, divorced, separated, never married, or living with a partner?: Married  Intimate Partner Violence: Not At Risk (03/22/2024)   Humiliation, Afraid, Rape, and Kick questionnaire    Fear of Current or Ex-Partner: No    Emotionally Abused: No    Physically Abused: No    Sexually Abused: No    FAMILY HISTORY: Family History  Problem Relation Age of Onset   Hypertension Mother     ALLERGIES:  is allergic to plavix [clopidogrel].  MEDICATIONS:  Current Outpatient Medications  Medication Sig Dispense Refill   aspirin EC 81 MG tablet Take 81 mg by mouth daily.     ELIQUIS 5 MG TABS tablet Take 5 mg by mouth 2 (two) times daily.     lisinopril (PRINIVIL,ZESTRIL) 20 MG tablet Take 20 mg by  mouth daily.     metoprolol tartrate (LOPRESSOR) 25 MG tablet Take 12.5 mg by mouth 2 (two) times daily.      nitroGLYCERIN  (NITROSTAT ) 0.4 MG SL tablet Place 1 tablet (0.4 mg total) under the tongue every 5 (five) minutes x 3 doses as needed for chest pain (if no relief after 2nd dose, proceed to ED or call 911). 25 tablet 3   omeprazole (PRILOSEC) 20 MG capsule Take 20 mg by mouth in the morning and at bedtime.      rosuvastatin (CRESTOR) 5 MG tablet Take 5 mg by mouth every Sunday.      trolamine salicylate (ASPERCREME) 10 % cream Apply 1 application topically 3 (three) times daily as needed for muscle pain (muscle/back pain.).      No current facility-administered medications for this visit.    REVIEW OF SYSTEMS:   Constitutional: ( - ) fevers, ( - )  chills , ( - )  night sweats Eyes: ( - ) blurriness of vision, ( - ) double vision, ( - ) watery eyes Ears, nose, mouth, throat, and face: ( - ) mucositis, ( - ) sore throat Respiratory: ( - ) cough, ( - ) dyspnea, ( - ) wheezes Cardiovascular: ( - ) palpitation, ( - ) chest discomfort, ( - ) lower extremity swelling Gastrointestinal:  ( - ) nausea, ( - ) heartburn, ( - ) change in bowel habits Skin: ( - ) abnormal skin rashes Lymphatics: ( - ) new lymphadenopathy, ( - ) easy bruising Neurological: ( - ) numbness, ( - ) tingling, ( - ) new weaknesses Behavioral/Psych: ( - ) mood change, ( - ) new changes  All other systems were reviewed with the patient and are negative.  PHYSICAL EXAMINATION: ECOG PERFORMANCE STATUS: 1 - Symptomatic but completely ambulatory  Vitals:   03/22/24 1115 03/22/24 1119  BP: (!) 151/80 (!) 148/82  Pulse: 87   Resp: 20   Temp: 99 F (37.2 C)   SpO2: 99%    Filed Weights   03/22/24 1115  Weight: 260 lb 5.8 oz (118.1 kg)    GENERAL: well appearing male in NAD  SKIN: skin color, texture, turgor are normal, no rashes or significant lesions EYES: conjunctiva are pink and non-injected, sclera  clear LUNGS: clear to auscultation and percussion with normal breathing effort HEART: regular rate & rhythm and no murmurs and no lower extremity edema Musculoskeletal: no cyanosis of digits and no clubbing  PSYCH: alert & oriented x 3, fluent speech NEURO: no focal motor/sensory deficits  LABORATORY DATA:  I have reviewed the data as listed    Latest Ref Rng & Units 03/22/2024   12:22 PM  CBC  WBC 4.0 - 10.5 K/uL 9.1   Hemoglobin 13.0 - 17.0 g/dL 85.7   Hematocrit 60.9 - 52.0 % 43.2   Platelets 150 - 400 K/uL 203         No data to display           RADIOGRAPHIC STUDIES: I have personally reviewed the radiological images as listed and agreed with the findings in the report. No results found.  ASSESSMENT & PLAN FLAY GHOSH is a 78 y.o. male who presents to the hematology clinic for evaluation for recent diagnosis of saddle pulmonary embolism.   I reviewed the possible etiologies for venous thromboembolism including prolonged travel/immobility, surgery (particular abdominal or orthropedic), trauma,  and pregnancy/ estrogen containing birth control. After a detailed history and review of the records there is no clear provoking factor for this patient's VTE.  Patients with unprovoked VTEs have up to 25% recurrence after 5 years and 36% at 10 years, with 4% of these clots being fatal ([id number] [phone]). Therefore the formal recommendation for unprovoked VTE's is lifelong anticoagulation, as the cause may not be transient or reversible. We recommend 6 months or full strength anticoagulation with a re-evaluation after that time.  The patient's will then have a choice of maintenance dose DOAC (preferred, recommended), 81mg  ASA PO daily (non-preferred), or no further anticoagulation (not recommended).   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  --recommend the patient continue eliquis 5mg  BID --patient denies any bleeding, bruising, or dark stools on this medication. It is well tolerated.  --the cost of the medication  is high but patient is willing to continue. I sent email for co-pay card activation. Advised to call back if he is not able to use co-pay card to evaluate for patient assistance.  --RTC in 3 months' time with strict return precautions for overt signs of bleeding.    Orders Placed This Encounter  Procedures   Antithrombin III     Standing Status:   Future    Number of Occurrences:   1    Expected Date:   03/22/2024    Expiration Date:   06/20/2024   Protein C activity    Standing Status:   Future    Number of Occurrences:   1    Expected Date:   03/22/2024    Expiration Date:   06/20/2024   Protein C, total    Standing Status:   Future    Number of Occurrences:   1    Expected Date:   03/22/2024    Expiration Date:   06/20/2024   Protein S activity    Standing Status:   Future    Number of Occurrences:   1    Expected Date:   03/22/2024    Expiration Date:   06/20/2024   Protein S, total    Standing Status:   Future    Number of Occurrences:   1    Expected Date:   03/22/2024    Expiration Date:   06/20/2024   Beta-2 -glycoprotein i abs, IgG/M/A    Standing Status:   Future    Number of Occurrences:   1    Expected Date:   03/22/2024    Expiration Date:   06/20/2024   Homocysteine, serum    Standing Status:   Future    Number of Occurrences:   1    Expected Date:   03/22/2024    Expiration Date:   06/20/2024   Factor 5 leiden    Standing Status:   Future    Number of Occurrences:   1    Expected Date:   03/22/2024    Expiration Date:   06/20/2024   Prothrombin gene mutation    Standing Status:   Future    Number of Occurrences:   1    Expected Date:   03/22/2024    Expiration Date:   06/20/2024   Cardiolipin  antibodies, IgG, IgM, IgA    Standing Status:   Future    Number of Occurrences:   1    Expected Date:   03/22/2024    Expiration Date:   06/20/2024   CBC with Differential    Standing Status:   Future    Number of Occurrences:   1    Expected Date:   03/22/2024    Expiration Date:   06/20/2024   Comprehensive metabolic panel    Standing Status:   Future    Number of Occurrences:   1    Expected Date:   03/22/2024    Expiration Date:   06/20/2024    All questions were answered. The patient knows to call the clinic with any problems, questions or concerns.  I have spent a total of 60 minutes minutes of face-to-face and non-face-to-face time, preparing to see the patient, obtaining and/or reviewing separately obtained history, performing a medically appropriate examination, counseling and educating the patient, ordering medications/tests/procedures, referring and communicating with other health care professionals, documenting clinical information in the electronic health record, independently interpreting results and communicating results to the patient, and care  coordination.   Johnston Police, PA-C Department of Hematology/Oncology [facility] at [facility]

## 2024-03-22 ENCOUNTER — Encounter: Payer: Self-pay | Admitting: Physician Assistant

## 2024-03-22 ENCOUNTER — Inpatient Hospital Stay: Attending: Physician Assistant | Admitting: Physician Assistant

## 2024-03-22 ENCOUNTER — Inpatient Hospital Stay

## 2024-03-22 VITALS — BP 148/82 | HR 87 | Temp 99.0°F | Resp 20 | Ht 64.57 in | Wt 260.4 lb

## 2024-03-22 DIAGNOSIS — Z86711 Personal history of pulmonary embolism: Secondary | ICD-10-CM

## 2024-03-22 DIAGNOSIS — Z7901 Long term (current) use of anticoagulants: Secondary | ICD-10-CM | POA: Diagnosis not present

## 2024-03-22 DIAGNOSIS — I2692 Saddle embolus of pulmonary artery without acute cor pulmonale: Secondary | ICD-10-CM | POA: Insufficient documentation

## 2024-03-22 DIAGNOSIS — I10 Essential (primary) hypertension: Secondary | ICD-10-CM | POA: Diagnosis not present

## 2024-03-22 DIAGNOSIS — I2510 Atherosclerotic heart disease of native coronary artery without angina pectoris: Secondary | ICD-10-CM | POA: Insufficient documentation

## 2024-03-22 DIAGNOSIS — Z23 Encounter for immunization: Secondary | ICD-10-CM | POA: Diagnosis not present

## 2024-03-22 DIAGNOSIS — Z87891 Personal history of nicotine dependence: Secondary | ICD-10-CM | POA: Insufficient documentation

## 2024-03-22 LAB — COMPREHENSIVE METABOLIC PANEL WITH GFR
ALT: 16 U/L (ref 0–44)
AST: 19 U/L (ref 15–41)
Albumin: 3.6 g/dL (ref 3.5–5.0)
Alkaline Phosphatase: 56 U/L (ref 38–126)
Anion gap: 13 (ref 5–15)
BUN: 13 mg/dL (ref 8–23)
CO2: 22 mmol/L (ref 22–32)
Calcium: 9 mg/dL (ref 8.9–10.3)
Chloride: 103 mmol/L (ref 98–111)
Creatinine, Ser: 0.81 mg/dL (ref 0.61–1.24)
GFR, Estimated: >60 mL/min (ref >60)
Glucose, Bld: 88 mg/dL (ref 70–99)
Potassium: 3.9 mmol/L (ref 3.5–5.1)
Sodium: 138 mmol/L (ref 135–145)
Total Bilirubin: 0.7 mg/dL (ref 0.0–1.2)
Total Protein: 6.9 g/dL (ref 6.5–8.1)

## 2024-03-22 LAB — CBC WITH DIFFERENTIAL/PLATELET
Abs Immature Granulocytes: 0.03 K/uL (ref 0.00–0.07)
Basophils Absolute: 0.1 K/uL (ref 0.0–0.1)
Basophils Relative: 1 %
Eosinophils Absolute: 0.5 K/uL (ref 0.0–0.5)
Eosinophils Relative: 5 %
HCT: 43.2 % (ref 39.0–52.0)
Hemoglobin: 14.2 g/dL (ref 13.0–17.0)
Immature Granulocytes: 0 %
Lymphocytes Relative: 21 %
Lymphs Abs: 2 K/uL (ref 0.7–4.0)
MCH: 33 pg (ref 26.0–34.0)
MCHC: 32.9 g/dL (ref 30.0–36.0)
MCV: 100.5 fL — ABNORMAL HIGH (ref 80.0–100.0)
Monocytes Absolute: 0.7 K/uL (ref 0.1–1.0)
Monocytes Relative: 8 %
Neutro Abs: 5.9 K/uL (ref 1.7–7.7)
Neutrophils Relative %: 65 %
Platelets: 203 K/uL (ref 150–400)
RBC: 4.3 MIL/uL (ref 4.22–5.81)
RDW: 14.1 % (ref 11.5–15.5)
WBC: 9.1 K/uL (ref 4.0–10.5)
nRBC: 0 % (ref 0.0–0.2)

## 2024-03-23 ENCOUNTER — Other Ambulatory Visit: Payer: Self-pay | Admitting: *Deleted

## 2024-03-23 ENCOUNTER — Inpatient Hospital Stay

## 2024-03-23 DIAGNOSIS — Z86711 Personal history of pulmonary embolism: Secondary | ICD-10-CM

## 2024-03-23 LAB — HOMOCYSTEINE: Homocysteine: 12 umol/L (ref 0.0–19.2)

## 2024-03-24 LAB — PROTEIN S ACTIVITY: Protein S Activity: 84 % (ref 63–140)

## 2024-03-24 LAB — PROTEIN C ACTIVITY: Protein C Activity: 97 % (ref 73–180)

## 2024-03-24 LAB — PROTEIN S, TOTAL: Protein S Ag, Total: 90 % (ref 60–150)

## 2024-03-25 ENCOUNTER — Encounter: Payer: Self-pay | Admitting: *Deleted

## 2024-03-25 LAB — BETA-2-GLYCOPROTEIN I ABS, IGG/M/A
Beta-2 Glyco I IgG: <9 GPI IgG units (ref 0–20)
Beta-2-Glycoprotein I IgA: <9 GPI IgA units (ref 0–25)
Beta-2-Glycoprotein I IgM: <9 GPI IgM units (ref 0–32)

## 2024-03-25 LAB — CARDIOLIPIN ANTIBODIES, IGG, IGM, IGA
Anticardiolipin IgA: <9 U/mL (ref 0–11)
Anticardiolipin IgG: <9 GPL U/mL (ref 0–14)
Anticardiolipin IgM: <9 [MPL'U]/mL (ref 0–12)

## 2024-03-25 LAB — PROTEIN C, TOTAL: Protein C, Total: 89 % (ref 60–150)

## 2024-03-25 NOTE — Progress Notes (Signed)
 Antithrombin 3 lab specimen compromised.  Per Johnston Police, NP, specimen does not require recollection at this time.

## 2024-03-26 LAB — PROTHROMBIN GENE MUTATION

## 2024-03-26 LAB — FACTOR 5 LEIDEN

## 2024-03-29 ENCOUNTER — Telehealth (HOSPITAL_COMMUNITY): Payer: Self-pay | Admitting: Physician Assistant

## 2024-03-29 NOTE — Telephone Encounter (Signed)
 I spoke to patient's wife, Melvin Harris, to review the lab results from 03/22/2024.  Findings showed no cytopenias and CMP was unremarkable.  The hypercoagulable workup was negative without any evidence of underlying clotting disorder.  Reviewed the patient has an unprovoked pulmonary embolism and the recommendation would be indefinite anticoagulation.  Advised patient to continue on Eliquis 5 mg p.o. twice daily.  Patient is scheduled for follow-up in 3 months.  [name] expressed understanding and satisfaction with the plan provided.

## 2024-04-13 DIAGNOSIS — Z299 Encounter for prophylactic measures, unspecified: Secondary | ICD-10-CM | POA: Diagnosis not present

## 2024-04-13 DIAGNOSIS — I350 Nonrheumatic aortic (valve) stenosis: Secondary | ICD-10-CM | POA: Diagnosis not present

## 2024-04-13 DIAGNOSIS — I2510 Atherosclerotic heart disease of native coronary artery without angina pectoris: Secondary | ICD-10-CM | POA: Diagnosis not present

## 2024-04-13 DIAGNOSIS — G2581 Restless legs syndrome: Secondary | ICD-10-CM | POA: Diagnosis not present

## 2024-04-13 DIAGNOSIS — I10 Essential (primary) hypertension: Secondary | ICD-10-CM | POA: Diagnosis not present

## 2024-04-13 DIAGNOSIS — I82409 Acute embolism and thrombosis of unspecified deep veins of unspecified lower extremity: Secondary | ICD-10-CM | POA: Diagnosis not present

## 2024-06-01 DIAGNOSIS — Z0000 Encounter for general adult medical examination without abnormal findings: Secondary | ICD-10-CM | POA: Diagnosis not present

## 2024-06-01 DIAGNOSIS — Z7189 Other specified counseling: Secondary | ICD-10-CM | POA: Diagnosis not present

## 2024-06-01 DIAGNOSIS — Z1331 Encounter for screening for depression: Secondary | ICD-10-CM | POA: Diagnosis not present

## 2024-06-01 DIAGNOSIS — Z299 Encounter for prophylactic measures, unspecified: Secondary | ICD-10-CM | POA: Diagnosis not present

## 2024-06-01 DIAGNOSIS — I10 Essential (primary) hypertension: Secondary | ICD-10-CM | POA: Diagnosis not present

## 2024-06-01 DIAGNOSIS — Z1339 Encounter for screening examination for other mental health and behavioral disorders: Secondary | ICD-10-CM | POA: Diagnosis not present

## 2024-06-01 DIAGNOSIS — T50905A Adverse effect of unspecified drugs, medicaments and biological substances, initial encounter: Secondary | ICD-10-CM | POA: Diagnosis not present

## 2024-06-01 DIAGNOSIS — L658 Other specified nonscarring hair loss: Secondary | ICD-10-CM | POA: Diagnosis not present

## 2024-06-21 ENCOUNTER — Other Ambulatory Visit: Payer: Self-pay

## 2024-06-21 ENCOUNTER — Inpatient Hospital Stay: Attending: Physician Assistant

## 2024-06-21 DIAGNOSIS — Z86711 Personal history of pulmonary embolism: Secondary | ICD-10-CM

## 2024-06-21 DIAGNOSIS — Z7901 Long term (current) use of anticoagulants: Secondary | ICD-10-CM | POA: Insufficient documentation

## 2024-06-21 DIAGNOSIS — I2692 Saddle embolus of pulmonary artery without acute cor pulmonale: Secondary | ICD-10-CM | POA: Insufficient documentation

## 2024-06-21 LAB — CBC
HCT: 46 % (ref 39.0–52.0)
Hemoglobin: 15.2 g/dL (ref 13.0–17.0)
MCH: 32.1 pg (ref 26.0–34.0)
MCHC: 33 g/dL (ref 30.0–36.0)
MCV: 97 fL (ref 80.0–100.0)
Platelets: 206 K/uL (ref 150–400)
RBC: 4.74 MIL/uL (ref 4.22–5.81)
RDW: 13.6 % (ref 11.5–15.5)
WBC: 8.5 K/uL (ref 4.0–10.5)
nRBC: 0 % (ref 0.0–0.2)

## 2024-06-21 LAB — D-DIMER, QUANTITATIVE: D-Dimer, Quant: 0.5 ug{FEU}/mL (ref 0.00–0.50)

## 2024-06-29 ENCOUNTER — Inpatient Hospital Stay (HOSPITAL_BASED_OUTPATIENT_CLINIC_OR_DEPARTMENT_OTHER): Admitting: Oncology

## 2024-06-29 VITALS — BP 148/80 | HR 81 | Temp 98.8°F | Resp 18 | Ht 66.0 in | Wt 268.0 lb

## 2024-06-29 DIAGNOSIS — I2692 Saddle embolus of pulmonary artery without acute cor pulmonale: Secondary | ICD-10-CM | POA: Diagnosis not present

## 2024-06-29 NOTE — Progress Notes (Unsigned)
 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  --recommend the patient continue eliquis 5mg  BID --patient denies any bleeding, bruising, or dark stools on this medication. It is well tolerated.  --the cost of the medication is high but patient is willing to continue. I sent email for co-pay card activation. Advised to call back if he is not able to use co-pay card to evaluate for patient assistance.  --RTC in 3 months' time with strict return precautions for overt signs of bleeding.   No orders of the defined types were placed in this encounter.   INTERVAL HISTORY: Patient returns for follow-up acute saddle pulmonary embolus in August 2025.  Melvin Harris 78 y.o. male with medical history significant for BPH, CAD with NSTEMI s/p cardiac cath, hypertension and GERD presents to the hematology clinic for evaluation for recent diagnosis of saddle pulmonary embolism. He is accompanied by his wife for this visit.   Hypercoagulable workup from 03/22/2024 was unremarkable.  Based on these results, it was recommended he be on lifelong anticoagulation.  He is currently on Eliquis and tolerating this well.   On exam today, Melvin Harris reports that he is recovering well after recent hospitalization. His breathing is almost back to his baseline and chest pain is nearly resolved. He is tolerating Eliquis therapy without any overt signs of bleeding including hematochezia, melena, epistaxis.  He denies fevers, chills, nausea, vomiting, bowel habit changes, headaches or dizziness.  He has no other complaint  We reviewed CBC and D-dimer from 06/21/2024.  SUMMARY OF  HEMATOLOGIC HISTORY: Oncology History   No problem history exists.    02/01/2024-02/04/2024: Admitted for acute saddle pulmonary embolism after presenting with acute left-sided chest pain. CTA chest revealed large pulmonary emboli extending into the interlobular and segmental branches bilaterally. Echo found no definitive CT evidence for right heart strain, SPECT high risk however with no cardiac marker elevation, no shock, no RV dysfunction. Echocardiogram revealing EF of 60 to 65% with no right heart strain, some pulmonary hypertension. Patient was started on heparin drip which was transitioned to Eliquis therapy. 03/22/2024: Establish care with [facility] Hematology  CBC    Component Value Date/Time   WBC 8.5 06/21/2024 0953   RBC 4.74 06/21/2024 0953   HGB 15.2 06/21/2024 0953   HCT 46.0 06/21/2024 0953   PLT 206 06/21/2024 0953   MCV 97.0 06/21/2024 0953   MCH 32.1 06/21/2024 0953   MCHC 33.0 06/21/2024 0953   RDW 13.6 06/21/2024 0953   LYMPHSABS 2.0 03/22/2024 1222   MONOABS 0.7 03/22/2024 1222   EOSABS 0.5 03/22/2024 1222   BASOSABS 0.1 03/22/2024 1222       Latest Ref Rng & Units 03/22/2024   12:22 PM  CMP  Glucose 70 - 99 mg/dL 88   BUN 8 - 23 mg/dL 13   Creatinine 9.38 - 1.24 mg/dL 9.18   Sodium 864 - 854 mmol/L 138   Potassium 3.5 - 5.1 mmol/L 3.9   Chloride 98 - 111 mmol/L 103   CO2 22 - 32 mmol/L 22   Calcium 8.9 - 10.3 mg/dL 9.0   Total Protein 6.5 - 8.1 g/dL 6.9   Total  Bilirubin 0.0 - 1.2 mg/dL 0.7   Alkaline Phos 38 - 126 U/L 56   AST 15 - 41 U/L 19   ALT 0 - 44 U/L 16      No results found for: FERRITIN, VITAMINB12  There were no vitals filed for this visit.  Review of System:  ROS  Physical Exam: Physical Exam Constitutional:      Appearance: Normal appearance.  HENT:     Head: Normocephalic and atraumatic.  Eyes:     Pupils: Pupils are equal, round, and reactive to light.  Cardiovascular:     Rate and Rhythm: Normal rate and regular rhythm.      Heart sounds: Normal heart sounds. No murmur heard. Pulmonary:     Effort: Pulmonary effort is normal.     Breath sounds: Normal breath sounds. No wheezing.  Abdominal:     General: Bowel sounds are normal. There is no distension.     Palpations: Abdomen is soft.     Tenderness: There is no abdominal tenderness.  Musculoskeletal:        General: Normal range of motion.     Cervical back: Normal range of motion.  Skin:    General: Skin is warm and dry.     Findings: No rash.  Neurological:     Mental Status: He is alert and oriented to person, place, and time.     Gait: Gait is intact.  Psychiatric:        Mood and Affect: Mood and affect normal.        Cognition and Memory: Memory normal.        Judgment: Judgment normal.      I spent 20 minutes dedicated to the care of this patient (face-to-face and non-face-to-face) on the date of the encounter to include what is described in the assessment and plan.,  Delon Hope, NP 06/29/2024 10:04 AM "

## 2024-06-29 NOTE — Assessment & Plan Note (Addendum)
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  --recommend the patient continue eliquis 5mg  BID --patient denies any bleeding, bruising, or dark stools on this medication. It is well tolerated.  --the cost of the medication is high but patient is willing to continue. I sent email for co-pay card activation. Advised to call back if he is not able to use co-pay card to evaluate for patient assistance.  --RTC in 3 months' time with strict return precautions for overt signs of bleeding.

## 2024-07-29 ENCOUNTER — Inpatient Hospital Stay: Attending: Physician Assistant

## 2024-07-29 DIAGNOSIS — I2692 Saddle embolus of pulmonary artery without acute cor pulmonale: Secondary | ICD-10-CM

## 2024-07-29 LAB — CBC
HCT: 43.9 % (ref 39.0–52.0)
Hemoglobin: 14.4 g/dL (ref 13.0–17.0)
MCH: 32.3 pg (ref 26.0–34.0)
MCHC: 32.8 g/dL (ref 30.0–36.0)
MCV: 98.4 fL (ref 80.0–100.0)
Platelets: 221 10*3/uL (ref 150–400)
RBC: 4.46 MIL/uL (ref 4.22–5.81)
RDW: 13.8 % (ref 11.5–15.5)
WBC: 8.3 10*3/uL (ref 4.0–10.5)
nRBC: 0 % (ref 0.0–0.2)

## 2024-07-29 LAB — D-DIMER, QUANTITATIVE: D-Dimer, Quant: 0.89 ug{FEU}/mL — ABNORMAL HIGH (ref 0.00–0.50)

## 2024-07-30 ENCOUNTER — Inpatient Hospital Stay

## 2024-08-06 ENCOUNTER — Inpatient Hospital Stay: Attending: Physician Assistant | Admitting: Oncology

## 2024-08-06 DIAGNOSIS — I2692 Saddle embolus of pulmonary artery without acute cor pulmonale: Secondary | ICD-10-CM

## 2024-08-06 NOTE — Progress Notes (Signed)
 "  [facility] OFFICE PROGRESS NOTE  Maree Isles, MD   I connected with Ubaldo JONELLE Loffler on 08/06/24 at  1:40 PM EST by telephone visit and verified that I am speaking with the correct person using two identifiers.   I discussed the limitations, risks, security and privacy concerns of performing an evaluation and management service by telemedicine and the availability of in-person appointments. I also discussed with the patient that there may be a patient responsible charge related to this service. The patient expressed understanding and agreed to proceed.   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 Hypercoagulable workup unremarkable. Previously on apixaban, discontinued due to alopecia and nephrotoxicity concerns. Asymptomatic off anticoagulation. Mildly elevated D-dimer likely due to non-thrombotic causes. No hemorrhage. Aware of recurrent thrombosis risk off anticoagulation, prefers to remain off therapy. - Remained off apixaban per his preference after discussion of risks and benefits, including risk of recurrent thrombosis and improvement in alopecia after discontinuation. - Discussed that mildly elevated D-dimer may reflect non-thrombotic etiologies and does not necessarily indicate recurrent thrombosis. - Ordered repeat D-dimer in one month. - Provided instructions to seek emergency care if symptoms of thromboembolism develop, including extremity swelling, cough, or dyspnea. - Arranged follow-up to review repeat D-dimer results.   Orders Placed This Encounter  Procedures   D-dimer, quantitative    Standing Status:   Future    Expected Date:   09/06/2024    Expiration Date:   08/06/2025   CBC     Standing Status:   Future    Expected Date:   09/06/2024    Expiration Date:   08/06/2025    INTERVAL HISTORY: Discussed the use of AI scribe software for clinical note transcription with the patient, who gave verbal consent to proceed.  History of Present Illness Melvin Harris is an 79 year old male with an unprovoked acute saddle pulmonary embolus who presents for hematology follow-up regarding discontinuation of anticoagulation due to adverse effects.  He was diagnosed with an unprovoked acute saddle pulmonary embolus in September 2025. Hypercoagulable workup was unremarkable. He was treated with Eliquis 5 mg twice daily. He reports no symptoms of recurrent thromboembolism, including swelling, cough, or dyspnea. Laboratory studies in December were unremarkable.  He discontinued Eliquis due to significant alopecia and concern for potential nephrotoxicity. After cessation, alopecia improved markedly. He is currently off anticoagulation and is willing to accept the risk of recurrence to avoid further hair loss, stating, I've never had a blood clot in [age] years and will take the risk.  A D-dimer obtained on July 29, 2024, was slightly elevated. He reports no bleeding, bruising, or dark stools.  Jun 29, 2024: Follow-up for acute saddle pulmonary embolism; patient on Eliquis after unprovoked PE, tolerating well except for hair loss. Recent D-dimer normal (06/21/2024). Discussed risks/benefits of stopping anticoagulation; plan to stop Eliquis and recheck D-dimer in one month; CBC and D-dimer scheduled. No bleeding, bruising, or other adverse symptoms noted.     SUMMARY OF HEMATOLOGIC HISTORY: Oncology History   No problem history exists.   02/01/2024-02/04/2024: Admitted for acute saddle pulmonary embolism after presenting with acute left-sided chest pain. CTA chest revealed large pulmonary emboli extending into the interlobular and segmental branches bilaterally. Echo found  no definitive CT  evidence for right heart strain, SPECT high risk however with no cardiac marker elevation, no shock, no RV dysfunction. Echocardiogram revealing EF of 60 to 65% with no right heart strain, some pulmonary hypertension. Patient was started on heparin drip which was transitioned to Eliquis therapy. 03/22/2024: Establish care with [facility] Hematology   CBC    Component Value Date/Time   WBC 8.3 07/29/2024 1006   RBC 4.46 07/29/2024 1006   HGB 14.4 07/29/2024 1006   HCT 43.9 07/29/2024 1006   PLT 221 07/29/2024 1006   MCV 98.4 07/29/2024 1006   MCH 32.3 07/29/2024 1006   MCHC 32.8 07/29/2024 1006   RDW 13.8 07/29/2024 1006   LYMPHSABS 2.0 03/22/2024 1222   MONOABS 0.7 03/22/2024 1222   EOSABS 0.5 03/22/2024 1222   BASOSABS 0.1 03/22/2024 1222       Latest Ref Rng & Units 03/22/2024   12:22 PM  CMP  Glucose 70 - 99 mg/dL 88   BUN 8 - 23 mg/dL 13   Creatinine 9.38 - 1.24 mg/dL 9.18   Sodium 864 - 854 mmol/L 138   Potassium 3.5 - 5.1 mmol/L 3.9   Chloride 98 - 111 mmol/L 103   CO2 22 - 32 mmol/L 22   Calcium 8.9 - 10.3 mg/dL 9.0   Total Protein 6.5 - 8.1 g/dL 6.9   Total Bilirubin 0.0 - 1.2 mg/dL 0.7   Alkaline Phos 38 - 126 U/L 56   AST 15 - 41 U/L 19   ALT 0 - 44 U/L 16      No results found for: FERRITIN, VITAMINB12  There were no vitals filed for this visit.  Review of System:  Review of Systems  Constitutional:  Negative for malaise/fatigue and weight loss.    Physical Exam: Physical Exam Neurological:     Mental Status: He is alert and oriented to person, place, and time.      I provided 12 minutes of non face-to-face telephone visit time during this encounter, and > 50% was spent counseling as documented under my assessment & plan.   Delon Hope, NP 08/06/2024 12:58 PM "

## 2024-09-15 ENCOUNTER — Inpatient Hospital Stay

## 2024-09-16 ENCOUNTER — Inpatient Hospital Stay

## 2024-09-22 ENCOUNTER — Inpatient Hospital Stay: Admitting: Oncology
